# Patient Record
Sex: Female | Born: 1990
Health system: Southern US, Community
[De-identification: ages and names within clinical notes are randomized; demographics above are authoritative.]

## PROBLEM LIST (undated history)

## (undated) DIAGNOSIS — E039 Hypothyroidism, unspecified: Secondary | ICD-10-CM

## (undated) DIAGNOSIS — F32A Depression, unspecified: Secondary | ICD-10-CM

## (undated) DIAGNOSIS — F329 Major depressive disorder, single episode, unspecified: Secondary | ICD-10-CM

## (undated) HISTORY — DX: Major depressive disorder, single episode, unspecified: F32.9

## (undated) HISTORY — PX: WISDOM TOOTH EXTRACTION: SHX21

## (undated) HISTORY — DX: Depression, unspecified: F32.A

## (undated) HISTORY — DX: Hypothyroidism, unspecified: E03.9

---

## 2012-10-15 LAB — HM PAP SMEAR

## 2015-08-30 ENCOUNTER — Other Ambulatory Visit: Payer: 59

## 2015-08-30 ENCOUNTER — Encounter: Payer: 59 | Admitting: Family Medicine

## 2015-08-30 ENCOUNTER — Encounter: Payer: Self-pay | Admitting: Family Medicine

## 2015-08-30 ENCOUNTER — Other Ambulatory Visit (INDEPENDENT_AMBULATORY_CARE_PROVIDER_SITE_OTHER): Payer: 59

## 2015-08-30 ENCOUNTER — Ambulatory Visit (INDEPENDENT_AMBULATORY_CARE_PROVIDER_SITE_OTHER): Payer: 59 | Admitting: Family Medicine

## 2015-08-30 VITALS — BP 102/70 | HR 76 | Temp 98.3°F | Resp 16 | Ht 62.0 in | Wt 126.0 lb

## 2015-08-30 DIAGNOSIS — R7989 Other specified abnormal findings of blood chemistry: Secondary | ICD-10-CM

## 2015-08-30 DIAGNOSIS — Z0000 Encounter for general adult medical examination without abnormal findings: Secondary | ICD-10-CM | POA: Insufficient documentation

## 2015-08-30 LAB — LIPID PANEL
CHOL/HDL RATIO: 3
CHOLESTEROL: 138 mg/dL (ref 0–200)
HDL: 55 mg/dL (ref >39.00)
LDL CALC: 76 mg/dL (ref 0–99)
NonHDL: 82.63
TRIGLYCERIDES: 33 mg/dL (ref 0.0–149.0)
VLDL: 6.6 mg/dL (ref 0.0–40.0)

## 2015-08-30 LAB — CBC WITH DIFFERENTIAL/PLATELET
Basophils Absolute: 0 10*3/uL (ref 0.0–0.1)
Basophils Relative: 0.7 % (ref 0.0–3.0)
EOS PCT: 2.3 % (ref 0.0–5.0)
Eosinophils Absolute: 0.1 10*3/uL (ref 0.0–0.7)
HEMATOCRIT: 42.8 % (ref 36.0–46.0)
HEMOGLOBIN: 14.3 g/dL (ref 12.0–15.0)
LYMPHS PCT: 33.8 % (ref 12.0–46.0)
Lymphs Abs: 1.6 10*3/uL (ref 0.7–4.0)
MCHC: 33.4 g/dL (ref 30.0–36.0)
MCV: 92.1 fl (ref 78.0–100.0)
MONOS PCT: 10.2 % (ref 3.0–12.0)
Monocytes Absolute: 0.5 10*3/uL (ref 0.1–1.0)
Neutro Abs: 2.5 10*3/uL (ref 1.4–7.7)
Neutrophils Relative %: 53 % (ref 43.0–77.0)
Platelets: 189 10*3/uL (ref 150.0–400.0)
RBC: 4.65 Mil/uL (ref 3.87–5.11)
RDW: 13.9 % (ref 11.5–15.5)
WBC: 4.6 10*3/uL (ref 4.0–10.5)

## 2015-08-30 LAB — BASIC METABOLIC PANEL
BUN: 19 mg/dL (ref 6–23)
CALCIUM: 9.5 mg/dL (ref 8.4–10.5)
CO2: 27 mEq/L (ref 19–32)
CREATININE: 0.71 mg/dL (ref 0.40–1.20)
Chloride: 105 mEq/L (ref 96–112)
GFR: 106.66 mL/min (ref >60.00)
GLUCOSE: 91 mg/dL (ref 70–99)
Potassium: 3.6 mEq/L (ref 3.5–5.1)
Sodium: 139 mEq/L (ref 135–145)

## 2015-08-30 LAB — VITAMIN D 25 HYDROXY (VIT D DEFICIENCY, FRACTURES): VITD: 41.57 ng/mL (ref 30.00–100.00)

## 2015-08-30 LAB — HEPATIC FUNCTION PANEL
ALBUMIN: 4.4 g/dL (ref 3.5–5.2)
ALK PHOS: 52 U/L (ref 39–117)
ALT: 10 U/L (ref 0–35)
AST: 22 U/L (ref 0–37)
Bilirubin, Direct: 0.1 mg/dL (ref 0.0–0.3)
TOTAL PROTEIN: 7.5 g/dL (ref 6.0–8.3)
Total Bilirubin: 0.6 mg/dL (ref 0.2–1.2)

## 2015-08-30 LAB — TSH: TSH: 3.95 u[IU]/mL (ref 0.35–4.50)

## 2015-08-30 LAB — T4, FREE: FREE T4: 0.68 ng/dL (ref 0.60–1.60)

## 2015-08-30 LAB — T3, FREE: T3 FREE: 2.5 pg/mL (ref 2.3–4.2)

## 2015-08-30 NOTE — Assessment & Plan Note (Signed)
Pt's PE WNL.  Due for pap but pt started her cycle yesterday.  Will reschedule her pap at her convenience.  Will get labs.  Anticipatory guidance provided.

## 2015-08-30 NOTE — Progress Notes (Signed)
   Subjective:    Patient ID: Tara Reyes, female    DOB: [birth year], 25 y.o.   MRN: [id number]  HPI New to establish.  Previous MD- [facility]  CPE- no concerns today, due for pap but started cycle   Review of Systems Patient reports no vision/ hearing changes, adenopathy,fever, weight change,  persistant/recurrent hoarseness , swallowing issues, chest pain, palpitations, edema, persistant/recurrent cough, hemoptysis, dyspnea (rest/exertional/paroxysmal nocturnal), gastrointestinal bleeding (melena, rectal bleeding), abdominal pain, significant heartburn, bowel changes, GU symptoms (dysuria, hematuria, incontinence), Gyn symptoms (abnormal  bleeding, pain),  syncope, focal weakness, memory loss, numbness & tingling, skin/hair/nail changes, abnormal bruising or bleeding, anxiety, or depression.     Objective:   Physical Exam General Appearance:    Alert, cooperative, no distress, appears stated age  Head:    Normocephalic, without obvious abnormality, atraumatic  Eyes:    PERRL, conjunctiva/corneas clear, EOM's intact, fundi    benign, both eyes  Ears:    Normal TM's and external ear canals, both ears  Nose:   Nares normal, septum midline, mucosa normal, no drainage    or sinus tenderness  Throat:   Lips, mucosa, and tongue normal; teeth and gums normal  Neck:   Supple, symmetrical, trachea midline, no adenopathy;    Thyroid: no enlargement/tenderness/nodules  Back:     Symmetric, no curvature, ROM normal, no CVA tenderness  Lungs:     Clear to auscultation bilaterally, respirations unlabored  Chest Wall:    No tenderness or deformity   Heart:    Regular rate and rhythm, S1 and S2 normal, no murmur, rub   or gallop  Breast Exam:    Deferred to GYN  Abdomen:     Soft, non-tender, bowel sounds active all four quadrants,    no masses, no organomegaly  Genitalia:    Deferred to GYN  Rectal:    Extremities:   Extremities normal, atraumatic, no cyanosis or edema  Pulses:   2+ and  symmetric all extremities  Skin:   Skin color, texture, turgor normal, no rashes or lesions  Lymph nodes:   Cervical, supraclavicular, and axillary nodes normal  Neurologic:   CNII-XII intact, normal strength, sensation and reflexes    throughout          Assessment & Plan:

## 2015-08-30 NOTE — Progress Notes (Signed)
Pre visit review using our clinic review tool, if applicable. No additional management support is needed unless otherwise documented below in the visit note. 

## 2015-08-30 NOTE — Patient Instructions (Signed)
Schedule a pap at your convenience We'll notify you of your lab results and make any changes if needed Keep up the good work!  You look great! Call with any questions or concerns Welcome!  We're glad to have you!!!

## 2015-08-30 NOTE — Addendum Note (Signed)
Addended by: Davis Gourd on: 08/30/2015 10:23 AM   Modules accepted: Medications

## 2015-08-31 NOTE — Progress Notes (Signed)
   Subjective:    Patient ID: Tara Reyes, female    DOB: 1990/05/03, 26 y.o.   MRN: [id number]  HPI    Review of Systems     Objective:   Physical Exam        Assessment & Plan:   This encounter was created in error - please disregard.

## 2015-09-01 ENCOUNTER — Encounter: Payer: Self-pay | Admitting: Family Medicine

## 2015-10-08 ENCOUNTER — Ambulatory Visit (INDEPENDENT_AMBULATORY_CARE_PROVIDER_SITE_OTHER): Payer: 59 | Admitting: Family Medicine

## 2015-10-08 ENCOUNTER — Encounter: Payer: Self-pay | Admitting: Family Medicine

## 2015-10-08 ENCOUNTER — Other Ambulatory Visit (HOSPITAL_COMMUNITY)
Admission: RE | Admit: 2015-10-08 | Discharge: 2015-10-08 | Disposition: A | Discharge status: Home / Self Care | Payer: 59 | Source: Ambulatory Visit | Attending: Family Medicine | Admitting: Family Medicine

## 2015-10-08 VITALS — BP 102/72 | HR 77 | Temp 98.1°F | Resp 16 | Ht 62.0 in | Wt 125.2 lb

## 2015-10-08 DIAGNOSIS — Z01419 Encounter for gynecological examination (general) (routine) without abnormal findings: Secondary | ICD-10-CM | POA: Diagnosis not present

## 2015-10-08 DIAGNOSIS — D235 Other benign neoplasm of skin of trunk: Secondary | ICD-10-CM | POA: Diagnosis not present

## 2015-10-08 DIAGNOSIS — Z124 Encounter for screening for malignant neoplasm of cervix: Secondary | ICD-10-CM | POA: Diagnosis not present

## 2015-10-08 DIAGNOSIS — Z1151 Encounter for screening for human papillomavirus (HPV): Secondary | ICD-10-CM | POA: Diagnosis not present

## 2015-10-08 DIAGNOSIS — H5203 Hypermetropia, bilateral: Secondary | ICD-10-CM | POA: Diagnosis not present

## 2015-10-08 DIAGNOSIS — D225 Melanocytic nevi of trunk: Secondary | ICD-10-CM

## 2015-10-08 NOTE — Progress Notes (Signed)
   Subjective:    Patient ID: Tara Reyes, female    DOB: August 27, 1990, 25 y.o.   MRN: [id number]  HPI Here today for pap and breast exam   Review of Systems No breast masses, skin changes, nipple discharge, vaginal discharge, itching/burning, abnormal bleeding    Objective:   Physical Exam  Constitutional: She appears well-developed and well-nourished. No distress.  HENT:  Head: Normocephalic and atraumatic.  Pulmonary/Chest: Right breast exhibits no inverted nipple, no mass, no nipple discharge, no skin change and no tenderness. Left breast exhibits no inverted nipple, no mass, no nipple discharge, no skin change and no tenderness.  Genitourinary: Rectal exam shows no external hemorrhoid, no internal hemorrhoid, no fissure, no mass and no tenderness. There is no rash, tenderness, lesion or injury on the right labia. There is no rash, tenderness, lesion or injury on the left labia. Uterus is not deviated, not enlarged, not fixed and not tender. Cervix exhibits no motion tenderness, no discharge and no friability. Right adnexum displays no mass, no tenderness and no fullness. Left adnexum displays no mass, no tenderness and no fullness. No erythema, tenderness or bleeding in the vagina. No foreign body around the vagina. No signs of injury around the vagina. No vaginal discharge found.  Skin: Skin is warm and dry.  Atypical nevus R groin  Vitals reviewed.         Assessment & Plan:  GYN exam- breast and pelvic exam WNL.  Pap collected  Atypical Nevus- R groin, refer to derm for removal

## 2015-10-08 NOTE — Progress Notes (Signed)
Pre visit review using our clinic review tool, if applicable. No additional management support is needed unless otherwise documented below in the visit note. 

## 2015-10-11 LAB — CYTOLOGY - PAP

## 2015-11-30 DIAGNOSIS — D2271 Melanocytic nevi of right lower limb, including hip: Secondary | ICD-10-CM | POA: Diagnosis not present

## 2015-11-30 DIAGNOSIS — D225 Melanocytic nevi of trunk: Secondary | ICD-10-CM | POA: Diagnosis not present

## 2015-11-30 DIAGNOSIS — D2272 Melanocytic nevi of left lower limb, including hip: Secondary | ICD-10-CM | POA: Diagnosis not present

## 2015-11-30 DIAGNOSIS — D2262 Melanocytic nevi of left upper limb, including shoulder: Secondary | ICD-10-CM | POA: Diagnosis not present

## 2015-12-23 ENCOUNTER — Encounter: Payer: Self-pay | Admitting: Family Medicine

## 2016-05-17 ENCOUNTER — Ambulatory Visit (INDEPENDENT_AMBULATORY_CARE_PROVIDER_SITE_OTHER): Payer: BLUE CROSS/BLUE SHIELD | Admitting: Family Medicine

## 2016-05-17 DIAGNOSIS — Z23 Encounter for immunization: Secondary | ICD-10-CM

## 2016-05-17 DIAGNOSIS — Z111 Encounter for screening for respiratory tuberculosis: Secondary | ICD-10-CM

## 2016-05-17 NOTE — Progress Notes (Signed)
Pt here today for Tdap and PPD.  Given by CMA.  Tolerated w/o difficulty.

## 2016-05-19 LAB — TB SKIN TEST
Induration: 0 mm
TB SKIN TEST: NEGATIVE

## 2016-09-22 ENCOUNTER — Encounter: Payer: Self-pay | Admitting: Family Medicine

## 2016-09-22 ENCOUNTER — Ambulatory Visit (INDEPENDENT_AMBULATORY_CARE_PROVIDER_SITE_OTHER): Payer: BLUE CROSS/BLUE SHIELD | Admitting: Family Medicine

## 2016-09-22 ENCOUNTER — Other Ambulatory Visit: Payer: Self-pay | Admitting: General Practice

## 2016-09-22 VITALS — BP 100/70 | HR 65 | Temp 98.2°F | Resp 16 | Ht 62.0 in | Wt 123.4 lb

## 2016-09-22 DIAGNOSIS — R7989 Other specified abnormal findings of blood chemistry: Secondary | ICD-10-CM

## 2016-09-22 DIAGNOSIS — Z0000 Encounter for general adult medical examination without abnormal findings: Secondary | ICD-10-CM | POA: Diagnosis not present

## 2016-09-22 LAB — CBC WITH DIFFERENTIAL/PLATELET
BASOS PCT: 1.2 % (ref 0.0–3.0)
Basophils Absolute: 0.1 10*3/uL (ref 0.0–0.1)
EOS ABS: 0.1 10*3/uL (ref 0.0–0.7)
Eosinophils Relative: 2.5 % (ref 0.0–5.0)
HEMATOCRIT: 44.3 % (ref 36.0–46.0)
HEMOGLOBIN: 15 g/dL (ref 12.0–15.0)
LYMPHS PCT: 31.4 % (ref 12.0–46.0)
Lymphs Abs: 1.4 10*3/uL (ref 0.7–4.0)
MCHC: 33.8 g/dL (ref 30.0–36.0)
MCV: 93.8 fl (ref 78.0–100.0)
Monocytes Absolute: 0.6 10*3/uL (ref 0.1–1.0)
Monocytes Relative: 12.1 % — ABNORMAL HIGH (ref 3.0–12.0)
Neutro Abs: 2.4 10*3/uL (ref 1.4–7.7)
Neutrophils Relative %: 52.8 % (ref 43.0–77.0)
Platelets: 209 10*3/uL (ref 150.0–400.0)
RBC: 4.72 Mil/uL (ref 3.87–5.11)
RDW: 12.5 % (ref 11.5–15.5)
WBC: 4.6 10*3/uL (ref 4.0–10.5)

## 2016-09-22 LAB — HEPATIC FUNCTION PANEL
ALT: 9 U/L (ref 0–35)
AST: 21 U/L (ref 0–37)
Albumin: 4.6 g/dL (ref 3.5–5.2)
Alkaline Phosphatase: 50 U/L (ref 39–117)
BILIRUBIN DIRECT: 0.1 mg/dL (ref 0.0–0.3)
BILIRUBIN TOTAL: 0.7 mg/dL (ref 0.2–1.2)
TOTAL PROTEIN: 7.5 g/dL (ref 6.0–8.3)

## 2016-09-22 LAB — VITAMIN D 25 HYDROXY (VIT D DEFICIENCY, FRACTURES): VITD: 30.38 ng/mL (ref 30.00–100.00)

## 2016-09-22 LAB — LIPID PANEL
CHOL/HDL RATIO: 3
Cholesterol: 152 mg/dL (ref 0–200)
HDL: 58.4 mg/dL (ref >39.00)
LDL CALC: 85 mg/dL (ref 0–99)
NonHDL: 93.34
TRIGLYCERIDES: 44 mg/dL (ref 0.0–149.0)
VLDL: 8.8 mg/dL (ref 0.0–40.0)

## 2016-09-22 LAB — BASIC METABOLIC PANEL
BUN: 18 mg/dL (ref 6–23)
CALCIUM: 9.6 mg/dL (ref 8.4–10.5)
CO2: 27 mEq/L (ref 19–32)
CREATININE: 0.73 mg/dL (ref 0.40–1.20)
Chloride: 105 mEq/L (ref 96–112)
GFR: 102.42 mL/min (ref >60.00)
Glucose, Bld: 90 mg/dL (ref 70–99)
Potassium: 4.7 mEq/L (ref 3.5–5.1)
Sodium: 140 mEq/L (ref 135–145)

## 2016-09-22 LAB — TSH: TSH: 4.87 u[IU]/mL — AB (ref 0.35–4.50)

## 2016-09-22 MED ORDER — LEVOTHYROXINE SODIUM 50 MCG PO TABS: 50.0000 ug | ORAL_TABLET | Freq: Every day | ORAL | 2 refills | Status: DC

## 2016-09-22 MED FILL — LEVOTHYROXINE 50 MCG TABLET: 50 | 30 days supply | Qty: 30 | Fill #0

## 2016-09-22 NOTE — Assessment & Plan Note (Signed)
Pt's PE WNL.  UTD on pap, Tdap.  Check labs.  Anticipatory guidance provided.  

## 2016-09-22 NOTE — Progress Notes (Signed)
Pre visit review using our clinic review tool, if applicable. No additional management support is needed unless otherwise documented below in the visit note. 

## 2016-09-22 NOTE — Patient Instructions (Signed)
Follow up in 1 year or as needed We'll notify you of your lab results and make any changes if needed Keep up the good work!  You look great!!! Call with any questions or concerns Happy Birthday!!!

## 2016-09-22 NOTE — Progress Notes (Signed)
   Subjective:    Patient ID: Tara Reyes, female    DOB: 30-Oct-1990, 26 y.o.   MRN: [id number]  HPI CPE- no concerns today, UTD on pap, Tdap.     Review of Systems Patient reports no vision/ hearing changes, adenopathy,fever, weight change,  persistant/recurrent hoarseness , swallowing issues, chest pain, palpitations, edema, persistant/recurrent cough, hemoptysis, dyspnea (rest/exertional/paroxysmal nocturnal), gastrointestinal bleeding (melena, rectal bleeding), abdominal pain, significant heartburn, bowel changes, GU symptoms (dysuria, hematuria, incontinence), Gyn symptoms (abnormal  bleeding, pain),  syncope, focal weakness, memory loss, numbness & tingling, skin/hair/nail changes, abnormal bruising or bleeding, anxiety, or depression.     Objective:   Physical Exam  General Appearance:    Alert, cooperative, no distress, appears stated age  Head:    Normocephalic, without obvious abnormality, atraumatic  Eyes:    PERRL, conjunctiva/corneas clear, EOM's intact, fundi    benign, both eyes  Ears:    Normal TM's and external ear canals, both ears  Nose:   Nares normal, septum midline, mucosa normal, no drainage    or sinus tenderness  Throat:   Lips, mucosa, and tongue normal; teeth and gums normal  Neck:   Supple, symmetrical, trachea midline, no adenopathy;    Thyroid: no enlargement/tenderness/nodules  Back:     Symmetric, no curvature, ROM normal, no CVA tenderness  Lungs:     Clear to auscultation bilaterally, respirations unlabored  Chest Wall:    No tenderness or deformity   Heart:    Regular rate and rhythm, S1 and S2 normal, no murmur, rub   or gallop  Breast Exam:    No tenderness, masses, or nipple abnormality  Abdomen:     Soft, non-tender, bowel sounds active all four quadrants,    no masses, no organomegaly  Genitalia:    deferred  Rectal:    Extremities:   Extremities normal, atraumatic, no cyanosis or edema  Pulses:   2+ and symmetric all extremities  Skin:    Skin color, texture, turgor normal, no rashes or lesions  Lymph nodes:   Cervical, supraclavicular, and axillary nodes normal  Neurologic:   CNII-XII intact, normal strength, sensation and reflexes    throughout          Assessment & Plan:

## 2016-09-26 ENCOUNTER — Encounter: Payer: Self-pay | Admitting: Family Medicine

## 2016-10-17 MED FILL — LEVOTHYROXINE 50 MCG TABLET: 50 | 30 days supply | Qty: 30 | Fill #1

## 2016-11-07 ENCOUNTER — Other Ambulatory Visit (INDEPENDENT_AMBULATORY_CARE_PROVIDER_SITE_OTHER): Payer: BLUE CROSS/BLUE SHIELD

## 2016-11-07 DIAGNOSIS — R946 Abnormal results of thyroid function studies: Secondary | ICD-10-CM

## 2016-11-07 DIAGNOSIS — R7989 Other specified abnormal findings of blood chemistry: Secondary | ICD-10-CM

## 2016-11-07 LAB — TSH: TSH: 2.1 u[IU]/mL (ref 0.35–4.50)

## 2016-11-08 ENCOUNTER — Other Ambulatory Visit: Payer: Self-pay | Admitting: General Practice

## 2016-11-08 MED ORDER — LEVOTHYROXINE SODIUM 50 MCG PO TABS: 50.0000 ug | ORAL_TABLET | Freq: Every day | ORAL | 1 refills | Status: DC

## 2017-05-16 ENCOUNTER — Encounter: Payer: Self-pay | Admitting: Family Medicine

## 2017-05-17 MED ORDER — LEVOTHYROXINE SODIUM 50 MCG PO TABS: 50.0000 ug | ORAL_TABLET | Freq: Every day | ORAL | 1 refills | Status: DC

## 2017-05-17 MED FILL — LEVOTHYROXINE 50 MCG TABLET: 50 | 90 days supply | Qty: 90 | Fill #0

## 2017-05-31 ENCOUNTER — Encounter: Payer: Self-pay | Admitting: Family Medicine

## 2017-06-01 MED ORDER — CLONAZEPAM 0.5 MG PO TABS: 0.5000 mg | ORAL_TABLET | Freq: Two times a day (BID) | ORAL | 0 refills | Status: DC | PRN

## 2017-06-01 MED FILL — clonazePAM 0.5 MG TABS: 0.5 | 10 days supply | Qty: 20 | Fill #0

## 2017-06-07 MED FILL — AZITHROMYCIN 500 MG TABLET: 500 | 3 days supply | Qty: 3 | Fill #0

## 2017-06-07 MED FILL — ATOVAQUONE-PROGUANIL 250-10: 250-100 | 22 days supply | Qty: 22 | Fill #0

## 2017-08-09 ENCOUNTER — Encounter: Payer: Self-pay | Admitting: Family Medicine

## 2017-08-09 ENCOUNTER — Ambulatory Visit (INDEPENDENT_AMBULATORY_CARE_PROVIDER_SITE_OTHER): Payer: No Typology Code available for payment source | Admitting: Family Medicine

## 2017-08-09 ENCOUNTER — Other Ambulatory Visit: Payer: Self-pay

## 2017-08-09 DIAGNOSIS — F339 Major depressive disorder, recurrent, unspecified: Secondary | ICD-10-CM | POA: Insufficient documentation

## 2017-08-09 MED ORDER — BUPROPION HCL ER (XL) 150 MG PO TB24: 150.0000 mg | ORAL_TABLET | Freq: Every day | ORAL | 3 refills | Status: DC

## 2017-08-09 MED FILL — buPROPion HCL ER (XL) 150 M: 150 | 30 days supply | Qty: 30 | Fill #0

## 2017-08-09 NOTE — Patient Instructions (Addendum)
Follow up as scheduled for your physical and to recheck mood START the Wellbutrin daily to improve mood Continue to work through therapy- I'm proud of you! Call with any questions or concerns Hang in there!!!

## 2017-08-09 NOTE — Assessment & Plan Note (Signed)
New to provider, recurrent problem for pt.  She tolerated Wellbutrin well in the past when Celexa and Zoloft were ineffective.  Given her low energy and low motivation, will restart Wellbutrin.  Applauded her efforts at counseling.  Will continue to follow closely.

## 2017-08-09 NOTE — Progress Notes (Signed)
   Subjective:    Patient ID: Tara Reyes, female    DOB: 08-30-1990, 27 y.o.   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  Hx of depression that has been triggered by 'life changes' in the past.  Pt has had 'a really hard time coping with the changes'.  Started counseling a month ago.  Pt will consider medication despite not wanting to b/c she feels like 'I have lost 8-10 months of my life'.  Poor sleep, low energy, poor motivation, increased fatigue.  Pt took medication previously- Celexa was ineffective.  Zoloft and Wellbutrin were used in combination and then Wellbutrin was used independently.  Husband is supportive.  Seeing Mardene Celeste at [employer]   Review of Systems For ROS see HPI     Objective:   Physical Exam  Constitutional: She is oriented to person, place, and time. She appears well-developed and well-nourished. No distress.  HENT:  Head: Normocephalic and atraumatic.  Neurological: She is alert and oriented to person, place, and time. No cranial nerve deficit.  Skin: Skin is warm and dry.  Psychiatric: Her behavior is normal. Thought content normal.  Flat affect, quiet and withdrawn today  Vitals reviewed.         Assessment & Plan:

## 2017-08-23 MED FILL — LEVOTHYROXINE 50 MCG TABLET: 50 | 90 days supply | Qty: 90 | Fill #1

## 2017-09-06 MED FILL — buPROPion HCL ER (XL) 150 M: 150 | 30 days supply | Qty: 30 | Fill #1

## 2017-09-26 ENCOUNTER — Encounter: Payer: Self-pay | Admitting: Family Medicine

## 2017-09-26 ENCOUNTER — Ambulatory Visit (INDEPENDENT_AMBULATORY_CARE_PROVIDER_SITE_OTHER): Payer: No Typology Code available for payment source | Admitting: Family Medicine

## 2017-09-26 ENCOUNTER — Other Ambulatory Visit: Payer: Self-pay

## 2017-09-26 VITALS — BP 118/64 | HR 90 | Temp 98.5°F | Resp 16 | Ht 62.25 in | Wt 119.6 lb

## 2017-09-26 DIAGNOSIS — Z0000 Encounter for general adult medical examination without abnormal findings: Secondary | ICD-10-CM | POA: Diagnosis not present

## 2017-09-26 DIAGNOSIS — E039 Hypothyroidism, unspecified: Secondary | ICD-10-CM | POA: Diagnosis not present

## 2017-09-26 LAB — BASIC METABOLIC PANEL
BUN: 8 mg/dL (ref 6–23)
CALCIUM: 9.1 mg/dL (ref 8.4–10.5)
CO2: 29 meq/L (ref 19–32)
Chloride: 103 mEq/L (ref 96–112)
Creatinine, Ser: 0.57 mg/dL (ref 0.40–1.20)
GFR: 135.22 mL/min (ref >60.00)
GLUCOSE: 88 mg/dL (ref 70–99)
Potassium: 3.8 mEq/L (ref 3.5–5.1)
SODIUM: 138 meq/L (ref 135–145)

## 2017-09-26 LAB — CBC WITH DIFFERENTIAL/PLATELET
BASOS ABS: 0.1 10*3/uL (ref 0.0–0.1)
Basophils Relative: 0.9 % (ref 0.0–3.0)
EOS ABS: 0.1 10*3/uL (ref 0.0–0.7)
Eosinophils Relative: 1.4 % (ref 0.0–5.0)
HEMATOCRIT: 42.1 % (ref 36.0–46.0)
Hemoglobin: 14.1 g/dL (ref 12.0–15.0)
LYMPHS PCT: 23.4 % (ref 12.0–46.0)
Lymphs Abs: 1.4 10*3/uL (ref 0.7–4.0)
MCHC: 33.4 g/dL (ref 30.0–36.0)
MCV: 94.3 fl (ref 78.0–100.0)
Monocytes Absolute: 0.5 10*3/uL (ref 0.1–1.0)
Monocytes Relative: 8.2 % (ref 3.0–12.0)
NEUTROS ABS: 4 10*3/uL (ref 1.4–7.7)
NEUTROS PCT: 66.1 % (ref 43.0–77.0)
PLATELETS: 206 10*3/uL (ref 150.0–400.0)
RBC: 4.47 Mil/uL (ref 3.87–5.11)
RDW: 13 % (ref 11.5–15.5)
WBC: 6 10*3/uL (ref 4.0–10.5)

## 2017-09-26 LAB — HEPATIC FUNCTION PANEL
ALBUMIN: 4.3 g/dL (ref 3.5–5.2)
ALT: 10 U/L (ref 0–35)
AST: 16 U/L (ref 0–37)
Alkaline Phosphatase: 52 U/L (ref 39–117)
BILIRUBIN DIRECT: 0.1 mg/dL (ref 0.0–0.3)
TOTAL PROTEIN: 6.8 g/dL (ref 6.0–8.3)
Total Bilirubin: 0.5 mg/dL (ref 0.2–1.2)

## 2017-09-26 LAB — LIPID PANEL
CHOL/HDL RATIO: 3
Cholesterol: 161 mg/dL (ref 0–200)
HDL: 63.3 mg/dL (ref >39.00)
LDL CALC: 90 mg/dL (ref 0–99)
NONHDL: 97.35
TRIGLYCERIDES: 39 mg/dL (ref 0.0–149.0)
VLDL: 7.8 mg/dL (ref 0.0–40.0)

## 2017-09-26 LAB — TSH: TSH: 1.38 u[IU]/mL (ref 0.35–4.50)

## 2017-09-26 NOTE — Patient Instructions (Signed)
Follow up in 1 year or as needed We'll notify you of your lab results and make any changes if needed Keep up the good work on healthy diet and regular exercise- you look great! Call with any questions or concerns Have a great summer!!! 

## 2017-09-26 NOTE — Assessment & Plan Note (Signed)
Ongoing issue.  Currently asymptomatic.  Check labs.  Adjust meds prn  

## 2017-09-26 NOTE — Progress Notes (Signed)
   Subjective:    Patient ID: Tara Reyes, female    DOB: 01/16/1991, 27 y.o.   MRN: [id number]  HPI CPE- UTD on pap, immunizations.  No concerns today.   Review of Systems Patient reports no vision/ hearing changes, adenopathy,fever, weight change,  persistant/recurrent hoarseness , swallowing issues, chest pain, palpitations, edema, persistant/recurrent cough, hemoptysis, dyspnea (rest/exertional/paroxysmal nocturnal), gastrointestinal bleeding (melena, rectal bleeding), abdominal pain, significant heartburn, bowel changes, GU symptoms (dysuria, hematuria, incontinence), Gyn symptoms (abnormal  bleeding, pain),  syncope, focal weakness, memory loss, numbness & tingling, skin/hair/nail changes, abnormal bruising or bleeding, anxiety, or depression.     Objective:   Physical Exam  General Appearance:    Alert, cooperative, no distress, appears stated age  Head:    Normocephalic, without obvious abnormality, atraumatic  Eyes:    PERRL, conjunctiva/corneas clear, EOM's intact, fundi    benign, both eyes  Ears:    Normal TM's and external ear canals, both ears  Nose:   Nares normal, septum midline, mucosa normal, no drainage    or sinus tenderness  Throat:   Lips, mucosa, and tongue normal; teeth and gums normal  Neck:   Supple, symmetrical, trachea midline, no adenopathy;    Thyroid: no enlargement/tenderness/nodules  Back:     Symmetric, no curvature, ROM normal, no CVA tenderness  Lungs:     Clear to auscultation bilaterally, respirations unlabored  Chest Wall:    No tenderness or deformity   Heart:    Regular rate and rhythm, S1 and S2 normal, no murmur, rub   or gallop  Breast Exam:    No tenderness, masses, or nipple abnormality  Abdomen:     Soft, non-tender, bowel sounds active all four quadrants,    no masses, no organomegaly  Genitalia:    deferred  Rectal:    Extremities:   Extremities normal, atraumatic, no cyanosis or edema  Pulses:   2+ and symmetric all extremities    Skin:   Skin color, texture, turgor normal, no rashes or lesions  Lymph nodes:   Cervical, supraclavicular, and axillary nodes normal  Neurologic:   CNII-XII intact, normal strength, sensation and reflexes    throughout          Assessment & Plan:

## 2017-09-26 NOTE — Assessment & Plan Note (Signed)
Pt's PE WNL.  UTD on immunizations.  Check labs.  Anticipatory guidance provided.  

## 2017-10-10 MED FILL — buPROPion HCL ER (XL) 150 M: 150 | 30 days supply | Qty: 30 | Fill #2

## 2017-11-15 MED FILL — buPROPion HCL ER (XL) 150 M: 150 | 30 days supply | Qty: 30 | Fill #3

## 2017-11-27 ENCOUNTER — Other Ambulatory Visit: Payer: Self-pay | Admitting: Family Medicine

## 2017-11-27 MED FILL — LEVOTHYROXINE 50 MCG TABLET: 50 | 90 days supply | Qty: 90 | Fill #0

## 2017-12-17 ENCOUNTER — Encounter: Payer: Self-pay | Admitting: Family Medicine

## 2017-12-17 DIAGNOSIS — Z111 Encounter for screening for respiratory tuberculosis: Secondary | ICD-10-CM

## 2017-12-18 ENCOUNTER — Other Ambulatory Visit (INDEPENDENT_AMBULATORY_CARE_PROVIDER_SITE_OTHER): Payer: No Typology Code available for payment source

## 2017-12-18 DIAGNOSIS — Z111 Encounter for screening for respiratory tuberculosis: Secondary | ICD-10-CM | POA: Diagnosis not present

## 2017-12-18 MED ORDER — BUPROPION HCL ER (XL) 150 MG PO TB24: 150.0000 mg | ORAL_TABLET | Freq: Every day | ORAL | 1 refills | Status: DC

## 2017-12-18 MED FILL — buPROPion HCL ER (XL) 150 M: 150 | 90 days supply | Qty: 90 | Fill #0

## 2017-12-21 LAB — QUANTIFERON-TB GOLD PLUS
Mitogen-NIL: >10 IU/mL
NIL: 0.03 IU/mL
QUANTIFERON-TB GOLD PLUS: NEGATIVE
TB1-NIL: 0.01 IU/mL
TB2-NIL: 0.02 IU/mL

## 2018-02-13 ENCOUNTER — Ambulatory Visit (INDEPENDENT_AMBULATORY_CARE_PROVIDER_SITE_OTHER): Payer: No Typology Code available for payment source | Admitting: Family Medicine

## 2018-02-13 ENCOUNTER — Other Ambulatory Visit: Payer: Self-pay

## 2018-02-13 ENCOUNTER — Encounter: Payer: Self-pay | Admitting: Family Medicine

## 2018-02-13 ENCOUNTER — Ambulatory Visit (INDEPENDENT_AMBULATORY_CARE_PROVIDER_SITE_OTHER): Payer: No Typology Code available for payment source

## 2018-02-13 VITALS — BP 108/68 | HR 63 | Temp 97.9°F | Ht 62.25 in | Wt 120.9 lb

## 2018-02-13 DIAGNOSIS — M25561 Pain in right knee: Secondary | ICD-10-CM

## 2018-02-13 NOTE — Patient Instructions (Signed)
Consider alternative activity to running such as cycling for the next few weeks  Consider icing 15-20 minutes 2-3 times daily and especially after exercise  Touch base if no better in 2-3 weeks.

## 2018-02-13 NOTE — Progress Notes (Signed)
  Subjective:     Patient ID: Tara Reyes, female   DOB: 1990-07-03, 27 y.o.   MRN: [id number]  HPI Patient is seen with right knee pain.  Onset on the ninth of this month.  She was in the middle of a long run and was going down a hill and around a corner when she noticed some mostly lateral knee pain.  No buckling or giving way.  She was in process of training for half marathon.  She has tried running a few times since then but has had some pain with running.  No visible swelling.  No warmth.  No ecchymosis.  Pain is mostly anterior and lateral.  No medial pain.  No history of prior knee difficulties.  Past Medical History:  Diagnosis Date  . Depression    Past Surgical History:  Procedure Laterality Date  . WISDOM TOOTH EXTRACTION      reports that she has never smoked. She has never used smokeless tobacco. She reports that she drinks alcohol. She reports that she does not use drugs. family history includes Arthritis in her maternal grandmother; Deep vein thrombosis in her paternal grandfather; Gout in her father; Heart attack in her maternal grandfather; Hypertension in her father and paternal grandfather. Allergies  Allergen Reactions  . Other Other (See Comments)    Sensitive to Vicryl sutures  . Penicillins Hives  . Other     Vicryl Suture  . Penicillins Hives     Review of Systems  Neurological: Negative for weakness.       Objective:   Physical Exam  Constitutional: She appears well-developed and well-nourished.  Musculoskeletal:  Right knee reveals good range of motion.  No effusion.  No visible swelling.  No warmth.  No erythema.  No medial or lateral joint line tenderness.  No patellar tenderness.  Ligament testing is normal.  No ITB tenderness.       Assessment:     Right knee pain.  Question mild lateral meniscus injury versus pinched synovial plica.  Doubt IT band syndrome    Plan:     -Recommended alternative exercise for the next couple weeks such as  cycling -Consider icing 15 to 20 minutes 2-3 times daily and especially after exercise -Touch base if not improving over the next couple weeks.  Consider sports medicine referral if not improved in 2 to 3 weeks  Eulas Post MD [facility] at [facility]

## 2018-02-14 ENCOUNTER — Encounter: Payer: Self-pay | Admitting: Family Medicine

## 2018-02-25 MED FILL — buPROPion HCL ER (XL) 150 M: 150 | 90 days supply | Qty: 90 | Fill #1

## 2018-02-25 MED FILL — LEVOTHYROXINE 50 MCG TABLET: 50 | 90 days supply | Qty: 90 | Fill #1

## 2018-04-24 ENCOUNTER — Encounter: Payer: Self-pay | Admitting: Family Medicine

## 2018-04-24 ENCOUNTER — Other Ambulatory Visit: Payer: Self-pay

## 2018-04-24 ENCOUNTER — Ambulatory Visit: Payer: BLUE CROSS/BLUE SHIELD | Admitting: Family Medicine

## 2018-04-24 VITALS — BP 96/68 | HR 61 | Temp 98.1°F | Resp 16 | Ht 62.0 in | Wt 124.0 lb

## 2018-04-24 DIAGNOSIS — M25561 Pain in right knee: Secondary | ICD-10-CM | POA: Diagnosis not present

## 2018-04-24 NOTE — Patient Instructions (Signed)
Follow up as needed or as scheduled We'll call you with your Sports Med appt Ice or Ibuprofen as needed Call with any questions or concerns Hang in there!!!

## 2018-04-24 NOTE — Progress Notes (Signed)
   Subjective:    Patient ID: Tara Reyes, female    DOB: 1990-06-30, 28 y.o.   MRN: [id number]  HPI R knee pain- injured knee ~3 months ago while running.  Saw Dr Elease Hashimoto ~2 weeks after and had normal xray.  Pain is mostly gone w/ day to day activity but returns w/ activity- even prolonged walking.  Pt would like to resume running.  No swelling.  No clicking or popping.  Pain is anterior, behind knee cap, 'in the joint space'.     Review of Systems For ROS see HPI     Objective:   Physical Exam Vitals signs reviewed.  Constitutional:      Appearance: Normal appearance. She is normal weight.  Musculoskeletal: Normal range of motion.        General: Swelling (trace swelling over patella tendon on R) present. No tenderness or deformity.     Comments: Full flexion/extension of R knee  Skin:    General: Skin is warm and dry.     Findings: No rash.  Neurological:     General: No focal deficit present.     Mental Status: She is alert and oriented to person, place, and time.  Psychiatric:        Mood and Affect: Mood normal.        Behavior: Behavior normal.        Thought Content: Thought content normal.           Assessment & Plan:  R knee pain- new to provider, ongoing for pt.  Suspect patellofemoral syndrome but since this has been going on for 12 weeks will refer to Sports Med for complete evaluation and tx.  Pt expressed understanding and is in agreement w/ plan.

## 2018-04-29 ENCOUNTER — Ambulatory Visit: Payer: BLUE CROSS/BLUE SHIELD | Admitting: Sports Medicine

## 2018-04-29 ENCOUNTER — Ambulatory Visit: Payer: Self-pay

## 2018-04-29 ENCOUNTER — Encounter: Payer: Self-pay | Admitting: Sports Medicine

## 2018-04-29 VITALS — BP 110/80 | HR 98 | Ht 62.0 in | Wt 122.8 lb

## 2018-04-29 DIAGNOSIS — M25561 Pain in right knee: Secondary | ICD-10-CM | POA: Insufficient documentation

## 2018-04-29 DIAGNOSIS — R29898 Other symptoms and signs involving the musculoskeletal system: Secondary | ICD-10-CM

## 2018-04-29 MED ORDER — DICLOFENAC SODIUM 2 % TD SOLN: 1.0000 "application " | Freq: Two times a day (BID) | TRANSDERMAL | 0 refills | Status: AC

## 2018-04-29 MED ORDER — DICLOFENAC SODIUM 2 % TD SOLN: 1.0000 "application " | Freq: Two times a day (BID) | TRANSDERMAL | 2 refills | Status: DC

## 2018-04-29 NOTE — Progress Notes (Signed)
Tara Reyes. [place], [place] at [place]  Tara Reyes - 28 y.o. female MRN [id number]  Date of birth: [birth year]  Visit Date: April 29, 2018  PCP: Midge Minium, MD   Referred by: Midge Minium, MD  SUBJECTIVE:  Chief Complaint  Patient presents with  . Right Knee - Initial Assessment    XR 02/13/18. Sx Started 12 weeks ago. Pain is anterior/lateral. Minimal swelling. Has tried IBU and ACE wrap. Worse with impact.   . Initial Assessment    R knee pain.  Referred by Dr. Birdie Riddle.     HPI: Patient presents with 12 weeks of worsening right generalized knee pain.  It initially began along the lateral patellar facet however has worsened over the past several months to the point that is now just generalized deep ache.  It is worse with running and impact.  Worse with the longer distance that she goes.  It initially began with 7-8/10 pain and is currently 2-3/10.  She is not having any significant mechanical symptoms.  Did begin while on a run on a sidewalk where she turned and had a sharp shooting pain but no mechanical symptoms were appreciated.  She has been resting using ice and elevation with only provement.  Ibuprofen was used intermittently with only mild improvement.  She has been using an Ace wrap and patellar strap without significant improvement.  REVIEW OF SYSTEMS: Slight lower extremity edema that is generalized especially worse at the end of the day Otherwise 12 point review of systems performed and is negative   HISTORY:  Prior history reviewed and updated per electronic medical record.  Patient Active Problem List   Diagnosis Date Noted  . Right knee pain 04/29/2018    02/13/2018 XR R knee -  IMPRESSION: No acute osseous abnormality about the right knee.   Marland Kitchen Hypothyroid 09/26/2017  . Depression, recurrent ([facility]) 08/09/2017  . Physical exam 08/30/2015   Social History   Occupational History    . Not on file  Tobacco Use  . Smoking status: Never Smoker  . Smokeless tobacco: Never Used  Substance and Sexual Activity  . Alcohol use: Yes    Alcohol/week: 0.0 standard drinks    Comment: social  . Drug use: No  . Sexual activity: Not on file   Social History   Social History Narrative   ** Merged History Encounter **       Past Medical History:  Diagnosis Date  . Depression    Past Surgical History:  Procedure Laterality Date  . WISDOM TOOTH EXTRACTION     family history includes Arthritis in her maternal grandmother; Deep vein thrombosis in her paternal grandfather; Gout in her father; Heart attack in her maternal grandfather; Hypertension in her father and paternal grandfather. Recent Labs    09/26/17 1416  CREATININE 0.57  CALCIUM 9.1  AST 16  ALT 10  TSH 1.38    OBJECTIVE:  VS:  HT:5\' 2"  (157.5 cm)   WT:122 lb 12.8 oz (55.7 kg)  BMI:22.45    BP:110/80  HR:98bpm  TEMP: ( )  RESP:100 %   PHYSICAL EXAM: CONSTITUTIONAL: Well-developed, Well-nourished and In no acute distress EYES: Pupils are equal., EOM intact without nystagmus. and No scleral icterus. Psychiatric: Alert & appropriately interactive. and Not depressed or anxious appearing. EXTREMITY EXAM: Warm and well perfused.  No appreciable edema.  Right knee: Overall well aligned without significant deformity.  She has good extensor  mechanism strength however has marked weakness with hip abduction on the right greater than left.  She has TFL predominant recruitment pattern. Her knee is ligamentously stable.  She has no significant pain with McMurray's no appreciable mechanical symptoms.   ASSESSMENT:  1. Right knee pain, unspecified chronicity   2. Weakness of right hip     PROCEDURES:  PROCEDURE NOTE: THERAPEUTIC EXERCISES (37482)   Discussed the foundation of treatment for this condition is physical therapy and/or daily (5-6 days/week) therapeutic exercises, focusing on core strengthening,  coordination, neuromuscular control/reeducation. 15 minutes spent for Therapeutic exercises as below and as referenced in the AVS. This included exercises focusing on stretching, strengthening, with significant focus on eccentric aspects.  Proper technique shown and discussed handout in great detail with ATC. All questions were discussed and answered.   Long term goals include an improvement in range of motion, strength, endurance as well as avoiding reinjury. Frequency of visits is one time as determined during today's office visit. Frequency of exercises to be performed is as per handout.  EXERCISES REVIEWED:   Hip ABduction strengthening with focus on Glute Medius Recruitment VMO Strengthening      PLAN:  Pertinent additional documentation may be included in corresponding procedure notes, imaging studies, problem based documentation and patient instructions.  No problem-specific Assessment & Plan notes found for this encounter.   Symptoms are consistent with slight quadriceps tendinopathy and a small amount of generalized synovitis with weak hip abductor's..  At this point we will have her topical anti-inflammatories to be taken for 2 to 3 weeks continuously then as needed.  Okay to resume activities as tolerated   Home Therapeutic exercises prescribed today per procedure note.  RICE (Rest, ICE, Compression, Elevation) principles reviewed with the patient.  Activity modifications and the importance of avoiding exacerbating activities (limiting pain to no more than a 4 / 10 during or following activity) recommended and discussed.  Discussed red flag symptoms that warrant earlier emergent evaluation and patient voices understanding.   Meds ordered this encounter  Medications  . Diclofenac Sodium (PENNSAID) 2 % SOLN    Sig: Place 1 application onto the skin 2 (two) times daily for 1 day.    Dispense:  8 g    Refill:  0  . Diclofenac Sodium (PENNSAID) 2 % SOLN    Sig: Place 1  application onto the skin 2 (two) times daily.    Dispense:  112 g    Refill:  2    Home Phone      [phone] Mobile          [phone]    Lab Orders  No laboratory test(s) ordered today    Imaging Orders     [place] [facility] POCT ULTRASOUND Referral Orders  No referral(s) requested today    Return in about 6 weeks (around 06/10/2018).          Gerda Diss, [place] [facility] Physician

## 2018-04-29 NOTE — Patient Instructions (Addendum)
Please perform the exercise program that we have prepared for you and gone over in detail on a daily basis.  In addition to the handout you were provided you can access your program through: [email or web address]   Your unique program code is:  [email or web address]   Pennsaid instructions: You have been given a sample/prescription for Pennsaid, a topical medication.     You are to apply this gel to your injured body part twice daily (morning and evening).   A little goes a long way so you can use about a pea-sized amount for each area.   Spread this small amount over the area into a thin film and let it dry.   Be sure that you do not rub the gel into your skin for more than 10 or 15 seconds otherwise it can irritate you skin.    Once you apply the gel, please do not put any other lotion or clothing in contact with that area for 30 minutes to allow the gel to absorb into your skin.   Some people are sensitive to the medication and can develop a sunburn-like rash.  If you have only mild symptoms it is okay to continue to use the medication but if you have any breakdown of your skin you should discontinue its use and please let us know.   If you have been written a prescription for Pennsaid, you will receive a pump bottle of this topical gel through a mail order pharmacy.  The instructions on the bottle will say to apply two pumps twice a day which may be too much gel for your particular area so use the pea-sized amount as your guide.  Instructions for Duexis, Pennsaid and Vimovo:  Your prescription will be filled through a participating [facility] mail order pharmacy.  You will receive a phone call or text from one of the participating pharmacies which can be located in any state in the [place].  You must communicate directly with them to have this medication filled.  When the pharmacy contacts you, they will need your mailing address (for shipment of the medication) andy they will need  payment information if you have a copay (typically no more than $10). If you have not heard from them 2-3 days after your appointment with Dr. Paulla Fore, contact [facility] directly at [phone].

## 2018-04-29 NOTE — Procedures (Signed)
LIMITED MSK ULTRASOUND OF Right knee Images were obtained and interpreted by myself, Teresa Coombs, DO  Images have been saved and stored to PACS system. Images obtained on: GE S7 Ultrasound machine  FINDINGS:  Patella & Patellar Tendon: Normal Quad & Quad Tendon: Slight hypoechoic change within the distal lateral quadriceps tendon Suprapatellar Pouch: Normal, no effusion Medial Joint Line: Slight bulging of the medial meniscus with slight inferior lead tracking fluid, prominent hypervascularity within the medial genicular artery Lateral Joint Line: Normal lateral meniscus Trochlea: Normal appearing cartilage Posterior knee: No appreciable Baker's cyst  IMPRESSION:  1. Small amount of right quadriceps tendinopathy 2. Slight bulge of the medial meniscus with mild hypervascular response.

## 2018-05-24 ENCOUNTER — Other Ambulatory Visit: Payer: Self-pay | Admitting: Family Medicine

## 2018-05-24 MED FILL — LEVOTHYROXINE 50 MCG TABLET: 50 | 30 days supply | Qty: 30 | Fill #0

## 2018-06-06 DIAGNOSIS — F331 Major depressive disorder, recurrent, moderate: Secondary | ICD-10-CM | POA: Diagnosis not present

## 2018-06-10 ENCOUNTER — Ambulatory Visit: Payer: BLUE CROSS/BLUE SHIELD | Admitting: Sports Medicine

## 2018-06-19 DIAGNOSIS — F331 Major depressive disorder, recurrent, moderate: Secondary | ICD-10-CM | POA: Diagnosis not present

## 2018-06-25 ENCOUNTER — Other Ambulatory Visit: Payer: Self-pay | Admitting: *Deleted

## 2018-06-25 ENCOUNTER — Encounter: Payer: Self-pay | Admitting: Family Medicine

## 2018-06-25 MED ORDER — BUPROPION HCL ER (XL) 150 MG PO TB24: 150.0000 mg | ORAL_TABLET | Freq: Every day | ORAL | 1 refills | Status: DC

## 2018-06-25 MED ORDER — LEVOTHYROXINE SODIUM 50 MCG PO TABS: 50.0000 ug | ORAL_TABLET | Freq: Every day | ORAL | 1 refills | Status: DC

## 2018-06-28 DIAGNOSIS — F331 Major depressive disorder, recurrent, moderate: Secondary | ICD-10-CM | POA: Diagnosis not present

## 2018-07-11 DIAGNOSIS — F331 Major depressive disorder, recurrent, moderate: Secondary | ICD-10-CM | POA: Diagnosis not present

## 2018-07-16 DIAGNOSIS — F331 Major depressive disorder, recurrent, moderate: Secondary | ICD-10-CM | POA: Diagnosis not present

## 2018-07-24 DIAGNOSIS — F331 Major depressive disorder, recurrent, moderate: Secondary | ICD-10-CM | POA: Diagnosis not present

## 2018-07-24 MED FILL — TRINTELLIX 10 MG TABLET: 10 | 30 days supply | Qty: 30 | Fill #0

## 2018-08-02 DIAGNOSIS — F331 Major depressive disorder, recurrent, moderate: Secondary | ICD-10-CM | POA: Diagnosis not present

## 2018-09-04 MED FILL — TRINTELLIX 10 MG TABLET: 10 | 30 days supply | Qty: 30 | Fill #1

## 2018-09-10 DIAGNOSIS — F4322 Adjustment disorder with anxiety: Secondary | ICD-10-CM | POA: Diagnosis not present

## 2018-09-17 DIAGNOSIS — F4322 Adjustment disorder with anxiety: Secondary | ICD-10-CM | POA: Diagnosis not present

## 2018-09-20 ENCOUNTER — Encounter: Payer: No Typology Code available for payment source | Admitting: Family Medicine

## 2018-09-27 DIAGNOSIS — F4322 Adjustment disorder with anxiety: Secondary | ICD-10-CM | POA: Diagnosis not present

## 2018-10-03 DIAGNOSIS — F4322 Adjustment disorder with anxiety: Secondary | ICD-10-CM | POA: Diagnosis not present

## 2018-10-10 DIAGNOSIS — F4322 Adjustment disorder with anxiety: Secondary | ICD-10-CM | POA: Diagnosis not present

## 2018-10-11 MED FILL — TRINTELLIX 10 MG TABLET: 10 | 30 days supply | Qty: 30 | Fill #2

## 2018-10-30 DIAGNOSIS — F4322 Adjustment disorder with anxiety: Secondary | ICD-10-CM | POA: Diagnosis not present

## 2018-10-31 ENCOUNTER — Encounter: Payer: BC Managed Care – PPO | Admitting: Family Medicine

## 2018-11-06 DIAGNOSIS — F4322 Adjustment disorder with anxiety: Secondary | ICD-10-CM | POA: Diagnosis not present

## 2018-11-11 DIAGNOSIS — F4322 Adjustment disorder with anxiety: Secondary | ICD-10-CM | POA: Diagnosis not present

## 2018-11-26 ENCOUNTER — Other Ambulatory Visit: Payer: Self-pay

## 2018-11-26 ENCOUNTER — Ambulatory Visit (INDEPENDENT_AMBULATORY_CARE_PROVIDER_SITE_OTHER): Payer: BC Managed Care – PPO | Admitting: Family Medicine

## 2018-11-26 ENCOUNTER — Encounter: Payer: Self-pay | Admitting: Family Medicine

## 2018-11-26 VITALS — BP 103/72 | HR 78 | Temp 98.1°F | Resp 16 | Ht 62.0 in | Wt 122.5 lb

## 2018-11-26 DIAGNOSIS — Z0000 Encounter for general adult medical examination without abnormal findings: Secondary | ICD-10-CM

## 2018-11-26 DIAGNOSIS — E039 Hypothyroidism, unspecified: Secondary | ICD-10-CM | POA: Diagnosis not present

## 2018-11-26 DIAGNOSIS — Z111 Encounter for screening for respiratory tuberculosis: Secondary | ICD-10-CM

## 2018-11-26 DIAGNOSIS — Z30433 Encounter for removal and reinsertion of intrauterine contraceptive device: Secondary | ICD-10-CM

## 2018-11-26 DIAGNOSIS — F339 Major depressive disorder, recurrent, unspecified: Secondary | ICD-10-CM | POA: Diagnosis not present

## 2018-11-26 LAB — CBC WITH DIFFERENTIAL/PLATELET
Basophils Absolute: 0 10*3/uL (ref 0.0–0.1)
Basophils Relative: 0.9 % (ref 0.0–3.0)
Eosinophils Absolute: 0.1 10*3/uL (ref 0.0–0.7)
Eosinophils Relative: 1.5 % (ref 0.0–5.0)
HCT: 44.9 % (ref 36.0–46.0)
Hemoglobin: 15.2 g/dL — ABNORMAL HIGH (ref 12.0–15.0)
Lymphocytes Relative: 31.8 % (ref 12.0–46.0)
Lymphs Abs: 1.2 10*3/uL (ref 0.7–4.0)
MCHC: 33.8 g/dL (ref 30.0–36.0)
MCV: 95.6 fl (ref 78.0–100.0)
Monocytes Absolute: 0.4 10*3/uL (ref 0.1–1.0)
Monocytes Relative: 11.2 % (ref 3.0–12.0)
Neutro Abs: 2.1 10*3/uL (ref 1.4–7.7)
Neutrophils Relative %: 54.6 % (ref 43.0–77.0)
Platelets: 222 10*3/uL (ref 150.0–400.0)
RBC: 4.7 Mil/uL (ref 3.87–5.11)
RDW: 12.2 % (ref 11.5–15.5)
WBC: 3.9 10*3/uL — ABNORMAL LOW (ref 4.0–10.5)

## 2018-11-26 LAB — BASIC METABOLIC PANEL
BUN: 13 mg/dL (ref 6–23)
CO2: 28 mEq/L (ref 19–32)
Calcium: 9.2 mg/dL (ref 8.4–10.5)
Chloride: 101 mEq/L (ref 96–112)
Creatinine, Ser: 0.64 mg/dL (ref 0.40–1.20)
GFR: 110.35 mL/min (ref >60.00)
Glucose, Bld: 83 mg/dL (ref 70–99)
Potassium: 3.9 mEq/L (ref 3.5–5.1)
Sodium: 137 mEq/L (ref 135–145)

## 2018-11-26 LAB — LIPID PANEL
Cholesterol: 164 mg/dL (ref 0–200)
HDL: 69.2 mg/dL (ref >39.00)
LDL Cholesterol: 87 mg/dL (ref 0–99)
NonHDL: 95.27
Total CHOL/HDL Ratio: 2
Triglycerides: 40 mg/dL (ref 0.0–149.0)
VLDL: 8 mg/dL (ref 0.0–40.0)

## 2018-11-26 LAB — HEPATIC FUNCTION PANEL
ALT: 11 U/L (ref 0–35)
AST: 20 U/L (ref 0–37)
Albumin: 4.2 g/dL (ref 3.5–5.2)
Alkaline Phosphatase: 49 U/L (ref 39–117)
Bilirubin, Direct: 0.1 mg/dL (ref 0.0–0.3)
Total Bilirubin: 0.6 mg/dL (ref 0.2–1.2)
Total Protein: 7 g/dL (ref 6.0–8.3)

## 2018-11-26 LAB — TSH: TSH: 1.72 u[IU]/mL (ref 0.35–4.50)

## 2018-11-26 NOTE — Assessment & Plan Note (Signed)
Currently well controlled.  No med changes.  Will follow.

## 2018-11-26 NOTE — Assessment & Plan Note (Signed)
Pt's PE WNL.  UTD on Tdap.  Will get flu at work.  GYN referral placed.  Check labs.  Anticipatory guidance provided.

## 2018-11-26 NOTE — Progress Notes (Signed)
   Subjective:    Patient ID: ANAYELY CHEESEBORO, female    DOB: 1990-11-27, 28 y.o.   MRN: [id number]  HPI CPE- UTD on Tdap, will get flu at work.  Wants GYN referral for IUD.   Review of Systems Patient reports no vision/ hearing changes, adenopathy,fever, weight change,  persistant/recurrent hoarseness , swallowing issues, chest pain, palpitations, edema, persistant/recurrent cough, hemoptysis, dyspnea (rest/exertional/paroxysmal nocturnal), gastrointestinal bleeding (melena, rectal bleeding), abdominal pain, significant heartburn, bowel changes, GU symptoms (dysuria, hematuria, incontinence), Gyn symptoms (abnormal  bleeding, pain),  syncope, focal weakness, memory loss, numbness & tingling, skin/hair/nail changes, abnormal bruising or bleeding, anxiety, or depression.     Objective:   Physical Exam General Appearance:    Alert, cooperative, no distress, appears stated age  Head:    Normocephalic, without obvious abnormality, atraumatic  Eyes:    PERRL, conjunctiva/corneas clear, EOM's intact, fundi    benign, both eyes  Ears:    Normal TM's and external ear canals, both ears  Nose:   Deferred due to COVID  Throat:   Neck:   Supple, symmetrical, trachea midline, no adenopathy;    Thyroid: no enlargement/tenderness/nodules  Back:     Symmetric, no curvature, ROM normal, no CVA tenderness  Lungs:     Clear to auscultation bilaterally, respirations unlabored  Chest Wall:    No tenderness or deformity   Heart:    Regular rate and rhythm, S1 and S2 normal, no murmur, rub   or gallop  Breast Exam:    Deferred to GYN  Abdomen:     Soft, non-tender, bowel sounds active all four quadrants,    no masses, no organomegaly  Genitalia:    Deferred to GYN  Rectal:    Extremities:   Extremities normal, atraumatic, no cyanosis or edema  Pulses:   2+ and symmetric all extremities  Skin:   Skin color, texture, turgor normal, no rashes or lesions  Lymph nodes:   Cervical, supraclavicular, and  axillary nodes normal  Neurologic:   CNII-XII intact, normal strength, sensation and reflexes    throughout          Assessment & Plan:

## 2018-11-26 NOTE — Patient Instructions (Signed)
Follow up in 1 year or as needed We'll notify you of your lab results and make any changes if needed Continue to work on healthy diet and regular exercise- you look great! Call with any questions or concerns Stay Safe!!! 

## 2018-11-26 NOTE — Assessment & Plan Note (Signed)
Chronic problem.  Currently asymptomatic.  Check labs.  Adjust meds prn  

## 2018-11-27 ENCOUNTER — Encounter: Payer: Self-pay | Admitting: General Practice

## 2018-11-27 DIAGNOSIS — F4322 Adjustment disorder with anxiety: Secondary | ICD-10-CM | POA: Diagnosis not present

## 2018-11-28 LAB — QUANTIFERON-TB GOLD PLUS
Mitogen-NIL: >10 IU/mL
NIL: 0.03 IU/mL
QuantiFERON-TB Gold Plus: NEGATIVE
TB1-NIL: 0 IU/mL
TB2-NIL: 0 IU/mL

## 2018-12-03 ENCOUNTER — Encounter: Payer: BC Managed Care – PPO | Admitting: Family Medicine

## 2018-12-05 ENCOUNTER — Other Ambulatory Visit: Payer: Self-pay | Admitting: Family Medicine

## 2018-12-10 DIAGNOSIS — F4322 Adjustment disorder with anxiety: Secondary | ICD-10-CM | POA: Diagnosis not present

## 2018-12-18 DIAGNOSIS — F4322 Adjustment disorder with anxiety: Secondary | ICD-10-CM | POA: Diagnosis not present

## 2018-12-22 ENCOUNTER — Other Ambulatory Visit: Payer: Self-pay | Admitting: Family Medicine

## 2018-12-23 DIAGNOSIS — F4322 Adjustment disorder with anxiety: Secondary | ICD-10-CM | POA: Diagnosis not present

## 2019-01-07 DIAGNOSIS — F4322 Adjustment disorder with anxiety: Secondary | ICD-10-CM | POA: Diagnosis not present

## 2019-01-08 DIAGNOSIS — F331 Major depressive disorder, recurrent, moderate: Secondary | ICD-10-CM | POA: Diagnosis not present

## 2019-01-10 ENCOUNTER — Encounter: Payer: Self-pay | Admitting: Family Medicine

## 2019-01-22 DIAGNOSIS — Z01419 Encounter for gynecological examination (general) (routine) without abnormal findings: Secondary | ICD-10-CM | POA: Diagnosis not present

## 2019-01-22 DIAGNOSIS — Z6823 Body mass index (BMI) 23.0-23.9, adult: Secondary | ICD-10-CM | POA: Diagnosis not present

## 2019-01-22 DIAGNOSIS — Z124 Encounter for screening for malignant neoplasm of cervix: Secondary | ICD-10-CM | POA: Diagnosis not present

## 2019-01-22 DIAGNOSIS — Z1389 Encounter for screening for other disorder: Secondary | ICD-10-CM | POA: Diagnosis not present

## 2019-03-11 DIAGNOSIS — Z30430 Encounter for insertion of intrauterine contraceptive device: Secondary | ICD-10-CM | POA: Diagnosis not present

## 2019-03-11 DIAGNOSIS — Z30432 Encounter for removal of intrauterine contraceptive device: Secondary | ICD-10-CM | POA: Diagnosis not present

## 2019-03-11 DIAGNOSIS — Z3202 Encounter for pregnancy test, result negative: Secondary | ICD-10-CM | POA: Diagnosis not present

## 2019-03-11 DIAGNOSIS — F4322 Adjustment disorder with anxiety: Secondary | ICD-10-CM | POA: Diagnosis not present

## 2019-03-31 DIAGNOSIS — F4322 Adjustment disorder with anxiety: Secondary | ICD-10-CM | POA: Diagnosis not present

## 2019-04-09 DIAGNOSIS — F3341 Major depressive disorder, recurrent, in partial remission: Secondary | ICD-10-CM | POA: Diagnosis not present

## 2019-04-09 DIAGNOSIS — F4322 Adjustment disorder with anxiety: Secondary | ICD-10-CM | POA: Diagnosis not present

## 2019-04-09 DIAGNOSIS — F411 Generalized anxiety disorder: Secondary | ICD-10-CM | POA: Diagnosis not present

## 2019-04-23 DIAGNOSIS — F4322 Adjustment disorder with anxiety: Secondary | ICD-10-CM | POA: Diagnosis not present

## 2019-04-24 DIAGNOSIS — Z30431 Encounter for routine checking of intrauterine contraceptive device: Secondary | ICD-10-CM | POA: Diagnosis not present

## 2019-05-07 DIAGNOSIS — F4322 Adjustment disorder with anxiety: Secondary | ICD-10-CM | POA: Diagnosis not present

## 2019-05-28 DIAGNOSIS — F3341 Major depressive disorder, recurrent, in partial remission: Secondary | ICD-10-CM | POA: Diagnosis not present

## 2019-05-28 DIAGNOSIS — F411 Generalized anxiety disorder: Secondary | ICD-10-CM | POA: Diagnosis not present

## 2019-06-04 DIAGNOSIS — F4322 Adjustment disorder with anxiety: Secondary | ICD-10-CM | POA: Diagnosis not present

## 2019-07-02 DIAGNOSIS — F4322 Adjustment disorder with anxiety: Secondary | ICD-10-CM | POA: Diagnosis not present

## 2019-08-11 DIAGNOSIS — F4322 Adjustment disorder with anxiety: Secondary | ICD-10-CM | POA: Diagnosis not present

## 2019-09-18 DIAGNOSIS — F4322 Adjustment disorder with anxiety: Secondary | ICD-10-CM | POA: Diagnosis not present

## 2019-10-21 DIAGNOSIS — F4322 Adjustment disorder with anxiety: Secondary | ICD-10-CM | POA: Diagnosis not present

## 2019-11-27 ENCOUNTER — Other Ambulatory Visit: Payer: Self-pay | Admitting: Family Medicine

## 2019-12-02 ENCOUNTER — Ambulatory Visit (INDEPENDENT_AMBULATORY_CARE_PROVIDER_SITE_OTHER): Payer: BC Managed Care – PPO | Admitting: Family Medicine

## 2019-12-02 ENCOUNTER — Other Ambulatory Visit: Payer: Self-pay

## 2019-12-02 ENCOUNTER — Encounter: Payer: Self-pay | Admitting: Family Medicine

## 2019-12-02 VITALS — BP 102/70 | HR 73 | Temp 97.9°F | Resp 16 | Ht 62.0 in | Wt 123.5 lb

## 2019-12-02 DIAGNOSIS — F4322 Adjustment disorder with anxiety: Secondary | ICD-10-CM | POA: Diagnosis not present

## 2019-12-02 DIAGNOSIS — E039 Hypothyroidism, unspecified: Secondary | ICD-10-CM

## 2019-12-02 DIAGNOSIS — Z0000 Encounter for general adult medical examination without abnormal findings: Secondary | ICD-10-CM | POA: Diagnosis not present

## 2019-12-02 LAB — LIPID PANEL
Cholesterol: 157 mg/dL (ref 0–200)
HDL: 61.8 mg/dL (ref >39.00)
LDL Cholesterol: 84 mg/dL (ref 0–99)
NonHDL: 95.51
Total CHOL/HDL Ratio: 3
Triglycerides: 59 mg/dL (ref 0.0–149.0)
VLDL: 11.8 mg/dL (ref 0.0–40.0)

## 2019-12-02 LAB — CBC WITH DIFFERENTIAL/PLATELET
Basophils Absolute: 0 10*3/uL (ref 0.0–0.1)
Basophils Relative: 0.8 % (ref 0.0–3.0)
Eosinophils Absolute: 0.1 10*3/uL (ref 0.0–0.7)
Eosinophils Relative: 1.7 % (ref 0.0–5.0)
HCT: 43.5 % (ref 36.0–46.0)
Hemoglobin: 14.6 g/dL (ref 12.0–15.0)
Lymphocytes Relative: 27.4 % (ref 12.0–46.0)
Lymphs Abs: 1.6 10*3/uL (ref 0.7–4.0)
MCHC: 33.6 g/dL (ref 30.0–36.0)
MCV: 96 fl (ref 78.0–100.0)
Monocytes Absolute: 0.7 10*3/uL (ref 0.1–1.0)
Monocytes Relative: 11.8 % (ref 3.0–12.0)
Neutro Abs: 3.3 10*3/uL (ref 1.4–7.7)
Neutrophils Relative %: 58.3 % (ref 43.0–77.0)
Platelets: 205 10*3/uL (ref 150.0–400.0)
RBC: 4.52 Mil/uL (ref 3.87–5.11)
RDW: 12.6 % (ref 11.5–15.5)
WBC: 5.7 10*3/uL (ref 4.0–10.5)

## 2019-12-02 LAB — TSH: TSH: 4.06 u[IU]/mL (ref 0.35–4.50)

## 2019-12-02 LAB — BASIC METABOLIC PANEL
BUN: 12 mg/dL (ref 6–23)
CO2: 28 mEq/L (ref 19–32)
Calcium: 9 mg/dL (ref 8.4–10.5)
Chloride: 102 mEq/L (ref 96–112)
Creatinine, Ser: 0.67 mg/dL (ref 0.40–1.20)
GFR: 103.92 mL/min (ref >60.00)
Glucose, Bld: 82 mg/dL (ref 70–99)
Potassium: 3.4 mEq/L — ABNORMAL LOW (ref 3.5–5.1)
Sodium: 138 mEq/L (ref 135–145)

## 2019-12-02 LAB — HEPATIC FUNCTION PANEL
ALT: 10 U/L (ref 0–35)
AST: 20 U/L (ref 0–37)
Albumin: 4.5 g/dL (ref 3.5–5.2)
Alkaline Phosphatase: 49 U/L (ref 39–117)
Bilirubin, Direct: 0.1 mg/dL (ref 0.0–0.3)
Total Bilirubin: 0.6 mg/dL (ref 0.2–1.2)
Total Protein: 7.1 g/dL (ref 6.0–8.3)

## 2019-12-02 NOTE — Progress Notes (Signed)
   Subjective:    Patient ID: Tara Reyes, female    DOB: 1990/09/11, 29 y.o.   MRN: [id number]  HPI CPE- UTD on COVID vaccines, Tdap, due for flu- will get at work.  UTD on GYN.  No concerns today.  Reviewed past medical, surgical, family and social histories.    Review of Systems Patient reports no vision/ hearing changes, adenopathy,fever, weight change,  persistant/recurrent hoarseness , swallowing issues, chest pain, palpitations, edema, persistant/recurrent cough, hemoptysis, dyspnea (rest/exertional/paroxysmal nocturnal), gastrointestinal bleeding (melena, rectal bleeding), abdominal pain, significant heartburn, bowel changes, GU symptoms (dysuria, hematuria, incontinence), Gyn symptoms (abnormal  bleeding, pain),  syncope, focal weakness, memory loss, numbness & tingling, skin/hair/nail changes, abnormal bruising or bleeding, anxiety, or depression.   This visit occurred during the SARS-CoV-2 public health emergency.  Safety protocols were in place, including screening questions prior to the visit, additional usage of staff PPE, and extensive cleaning of exam room while observing appropriate contact time as indicated for disinfecting solutions.       Objective:   Physical Exam General Appearance:    Alert, cooperative, no distress, appears stated age  Head:    Normocephalic, without obvious abnormality, atraumatic  Eyes:    PERRL, conjunctiva/corneas clear, EOM's intact, fundi    benign, both eyes  Ears:    Normal TM's and external ear canals, both ears  Nose:   Deferred due to COVID  Throat:   Neck:   Supple, symmetrical, trachea midline, no adenopathy;    Thyroid: no enlargement/tenderness/nodules  Back:     Symmetric, no curvature, ROM normal, no CVA tenderness  Lungs:     Clear to auscultation bilaterally, respirations unlabored  Chest Wall:    No tenderness or deformity   Heart:    Regular rate and rhythm, S1 and S2 normal, no murmur, rub   or gallop  Breast Exam:     Deferred to GYN  Abdomen:     Soft, non-tender, bowel sounds active all four quadrants,    no masses, no organomegaly  Genitalia:    Deferred to GYN  Rectal:    Extremities:   Extremities normal, atraumatic, no cyanosis or edema  Pulses:   2+ and symmetric all extremities  Skin:   Skin color, texture, turgor normal, no rashes or lesions  Lymph nodes:   Cervical, supraclavicular, and axillary nodes normal  Neurologic:   CNII-XII intact, normal strength, sensation and reflexes    throughout          Assessment & Plan:

## 2019-12-02 NOTE — Assessment & Plan Note (Signed)
Pt's PE WNL.  UTD on GYN, immunizations (will get flu shot through work).  Check labs.  Anticipatory guidance provided.

## 2019-12-02 NOTE — Patient Instructions (Signed)
Follow up in 1 year or as needed We'll notify you of your lab results and make any changes if needed Keep up the good work!  You look great! Call with any questions or concerns Stay Safe!  Stay Healthy!!! 

## 2019-12-02 NOTE — Assessment & Plan Note (Signed)
Chronic problem.  Currently asymptomatic.  Check labs.  Adjust meds prn  

## 2019-12-03 ENCOUNTER — Encounter: Payer: Self-pay | Admitting: General Practice

## 2019-12-22 ENCOUNTER — Encounter: Payer: Self-pay | Admitting: Family Medicine

## 2020-01-23 DIAGNOSIS — F4322 Adjustment disorder with anxiety: Secondary | ICD-10-CM | POA: Diagnosis not present

## 2020-03-04 DIAGNOSIS — F4322 Adjustment disorder with anxiety: Secondary | ICD-10-CM | POA: Diagnosis not present

## 2020-04-09 DIAGNOSIS — F4322 Adjustment disorder with anxiety: Secondary | ICD-10-CM | POA: Diagnosis not present

## 2020-05-15 ENCOUNTER — Encounter: Payer: Self-pay | Admitting: Family Medicine

## 2020-05-15 DIAGNOSIS — F339 Major depressive disorder, recurrent, unspecified: Secondary | ICD-10-CM

## 2020-05-17 MED ORDER — BUPROPION HCL ER (XL) 150 MG PO TB24: 150.0000 mg | ORAL_TABLET | Freq: Every day | ORAL | 3 refills | Status: DC

## 2020-06-29 DIAGNOSIS — F4322 Adjustment disorder with anxiety: Secondary | ICD-10-CM | POA: Diagnosis not present

## 2020-07-12 DIAGNOSIS — F4322 Adjustment disorder with anxiety: Secondary | ICD-10-CM | POA: Diagnosis not present

## 2020-08-05 DIAGNOSIS — F4322 Adjustment disorder with anxiety: Secondary | ICD-10-CM | POA: Diagnosis not present

## 2020-09-03 DIAGNOSIS — F4322 Adjustment disorder with anxiety: Secondary | ICD-10-CM | POA: Diagnosis not present

## 2020-09-15 DIAGNOSIS — F4322 Adjustment disorder with anxiety: Secondary | ICD-10-CM | POA: Diagnosis not present

## 2020-09-22 ENCOUNTER — Encounter: Payer: Self-pay | Admitting: *Deleted

## 2020-10-01 DIAGNOSIS — F4322 Adjustment disorder with anxiety: Secondary | ICD-10-CM | POA: Diagnosis not present

## 2020-10-20 DIAGNOSIS — F4322 Adjustment disorder with anxiety: Secondary | ICD-10-CM | POA: Diagnosis not present

## 2020-11-02 DIAGNOSIS — F4322 Adjustment disorder with anxiety: Secondary | ICD-10-CM | POA: Diagnosis not present

## 2020-11-16 DIAGNOSIS — F4322 Adjustment disorder with anxiety: Secondary | ICD-10-CM | POA: Diagnosis not present

## 2020-11-22 ENCOUNTER — Other Ambulatory Visit: Payer: Self-pay | Admitting: Family Medicine

## 2020-12-03 DIAGNOSIS — F4322 Adjustment disorder with anxiety: Secondary | ICD-10-CM | POA: Diagnosis not present

## 2020-12-16 DIAGNOSIS — F4322 Adjustment disorder with anxiety: Secondary | ICD-10-CM | POA: Diagnosis not present

## 2020-12-29 DIAGNOSIS — F4322 Adjustment disorder with anxiety: Secondary | ICD-10-CM | POA: Diagnosis not present

## 2021-01-13 DIAGNOSIS — F4322 Adjustment disorder with anxiety: Secondary | ICD-10-CM | POA: Diagnosis not present

## 2021-01-31 DIAGNOSIS — F4322 Adjustment disorder with anxiety: Secondary | ICD-10-CM | POA: Diagnosis not present

## 2021-02-10 ENCOUNTER — Ambulatory Visit (INDEPENDENT_AMBULATORY_CARE_PROVIDER_SITE_OTHER): Payer: BC Managed Care – PPO | Admitting: Family Medicine

## 2021-02-10 ENCOUNTER — Encounter: Payer: Self-pay | Admitting: Family Medicine

## 2021-02-10 VITALS — BP 104/64 | HR 68 | Temp 97.9°F | Resp 18 | Ht 62.0 in | Wt 126.6 lb

## 2021-02-10 DIAGNOSIS — E039 Hypothyroidism, unspecified: Secondary | ICD-10-CM

## 2021-02-10 DIAGNOSIS — Z0000 Encounter for general adult medical examination without abnormal findings: Secondary | ICD-10-CM | POA: Diagnosis not present

## 2021-02-10 DIAGNOSIS — N6321 Unspecified lump in the left breast, upper outer quadrant: Secondary | ICD-10-CM

## 2021-02-10 DIAGNOSIS — F339 Major depressive disorder, recurrent, unspecified: Secondary | ICD-10-CM | POA: Diagnosis not present

## 2021-02-10 LAB — TSH: TSH: 3.05 u[IU]/mL (ref 0.35–5.50)

## 2021-02-10 LAB — CBC WITH DIFFERENTIAL/PLATELET
Basophils Absolute: 0 10*3/uL (ref 0.0–0.1)
Basophils Relative: 0.8 % (ref 0.0–3.0)
Eosinophils Absolute: 0.1 10*3/uL (ref 0.0–0.7)
Eosinophils Relative: 2.3 % (ref 0.0–5.0)
HCT: 41.1 % (ref 36.0–46.0)
Hemoglobin: 13.4 g/dL (ref 12.0–15.0)
Lymphocytes Relative: 30.9 % (ref 12.0–46.0)
Lymphs Abs: 1.2 10*3/uL (ref 0.7–4.0)
MCHC: 32.6 g/dL (ref 30.0–36.0)
MCV: 92.1 fl (ref 78.0–100.0)
Monocytes Absolute: 0.5 10*3/uL (ref 0.1–1.0)
Monocytes Relative: 13.4 % — ABNORMAL HIGH (ref 3.0–12.0)
Neutro Abs: 2.1 10*3/uL (ref 1.4–7.7)
Neutrophils Relative %: 52.6 % (ref 43.0–77.0)
Platelets: 201 10*3/uL (ref 150.0–400.0)
RBC: 4.46 Mil/uL (ref 3.87–5.11)
RDW: 13.9 % (ref 11.5–15.5)
WBC: 4 10*3/uL (ref 4.0–10.5)

## 2021-02-10 LAB — HEPATIC FUNCTION PANEL
ALT: 10 U/L (ref 0–35)
AST: 18 U/L (ref 0–37)
Albumin: 4.2 g/dL (ref 3.5–5.2)
Alkaline Phosphatase: 48 U/L (ref 39–117)
Bilirubin, Direct: 0.1 mg/dL (ref 0.0–0.3)
Total Bilirubin: 0.5 mg/dL (ref 0.2–1.2)
Total Protein: 6.8 g/dL (ref 6.0–8.3)

## 2021-02-10 LAB — LIPID PANEL
Cholesterol: 161 mg/dL (ref 0–200)
HDL: 65.1 mg/dL (ref >39.00)
LDL Cholesterol: 88 mg/dL (ref 0–99)
NonHDL: 95.46
Total CHOL/HDL Ratio: 2
Triglycerides: 35 mg/dL (ref 0.0–149.0)
VLDL: 7 mg/dL (ref 0.0–40.0)

## 2021-02-10 LAB — BASIC METABOLIC PANEL
BUN: 15 mg/dL (ref 6–23)
CO2: 26 mEq/L (ref 19–32)
Calcium: 8.8 mg/dL (ref 8.4–10.5)
Chloride: 104 mEq/L (ref 96–112)
Creatinine, Ser: 0.67 mg/dL (ref 0.40–1.20)
GFR: 117.31 mL/min (ref >60.00)
Glucose, Bld: 85 mg/dL (ref 70–99)
Potassium: 4 mEq/L (ref 3.5–5.1)
Sodium: 137 mEq/L (ref 135–145)

## 2021-02-10 NOTE — Progress Notes (Signed)
   Subjective:    Patient ID: Tara Reyes, female    DOB: 1990/07/30, 30 y.o.   MRN: [id number]  HPI CPE- UTD on pap, Tdap, flu.  Health Maintenance  Topic Date Due   HIV Screening  Never done   Hepatitis C Screening  Never done   COVID-19 Vaccine (3 - Booster for Pfizer series) 02/26/2021 (Originally 05/31/2019)   PAP SMEAR-Modifier  12/01/2021   TETANUS/TDAP  05/17/2026   INFLUENZA VACCINE  Completed   HPV VACCINES  Completed   Pneumococcal Vaccine 55-8 Years old  Aged Out      Review of Systems Patient reports no vision/ hearing changes, adenopathy,fever, weight change,  persistant/recurrent hoarseness , swallowing issues, chest pain, palpitations, edema, persistant/recurrent cough, hemoptysis, dyspnea (rest/exertional/paroxysmal nocturnal), gastrointestinal bleeding (melena, rectal bleeding), abdominal pain, significant heartburn, bowel changes, GU symptoms (dysuria, hematuria, incontinence), Gyn symptoms (abnormal  bleeding, pain),  syncope, focal weakness, memory loss, numbness & tingling, skin/hair/nail changes, abnormal bruising or bleeding, anxiety, or depression.   + breast lump- occurred just prior to menses and resolved within a few days.  No TTP.  L lateral breast.    This visit occurred during the SARS-CoV-2 public health emergency.  Safety protocols were in place, including screening questions prior to the visit, additional usage of staff PPE, and extensive cleaning of exam room while observing appropriate contact time as indicated for disinfecting solutions.      Objective:   Physical Exam General Appearance:    Alert, cooperative, no distress, appears stated age  Head:    Normocephalic, without obvious abnormality, atraumatic  Eyes:    PERRL, conjunctiva/corneas clear, EOM's intact, fundi    benign, both eyes  Ears:    Normal TM's and external ear canals, both ears  Nose:   Deferred due to COVID  Throat:   Neck:   Supple, symmetrical, trachea midline, no  adenopathy;    Thyroid: no enlargement/tenderness/nodules  Back:     Symmetric, no curvature, ROM normal, no CVA tenderness  Lungs:     Clear to auscultation bilaterally, respirations unlabored  Chest Wall:    No tenderness or deformity   Heart:    Regular rate and rhythm, S1 and S2 normal, no murmur, rub   or gallop  Breast Exam:    Deferred to GYN  Abdomen:     Soft, non-tender, bowel sounds active all four quadrants,    no masses, no organomegaly  Genitalia:    Deferred to GYN  Rectal:    Extremities:   Extremities normal, atraumatic, no cyanosis or edema  Pulses:   2+ and symmetric all extremities  Skin:   Skin color, texture, turgor normal, no rashes or lesions  Lymph nodes:   Cervical, supraclavicular, and axillary nodes normal  Neurologic:   CNII-XII intact, normal strength, sensation and reflexes    throughout          Assessment & Plan:

## 2021-02-10 NOTE — Assessment & Plan Note (Signed)
Pt's PE WNL.  UTD on immunizations, pap.  Check labs.  Anticipatory guidance provided.

## 2021-02-10 NOTE — Patient Instructions (Signed)
Follow up in 1 year or as needed We'll notify you of your lab results and make any changes if needed Keep up the good work on healthy diet and regular exercise- you look great! We'll call you with your [place] appt Call with any questions or concerns Stay Safe!! Stay Healthy!! Happy Holidays!!

## 2021-02-10 NOTE — Assessment & Plan Note (Signed)
Chronic problem.  Currently asymptomatic.  Check labs.  Adjust meds prn  

## 2021-02-10 NOTE — Assessment & Plan Note (Signed)
PHQ=0.  No changes at this time

## 2021-02-14 ENCOUNTER — Other Ambulatory Visit: Payer: Self-pay | Admitting: Family Medicine

## 2021-02-14 DIAGNOSIS — N6321 Unspecified lump in the left breast, upper outer quadrant: Secondary | ICD-10-CM

## 2021-02-15 DIAGNOSIS — F4322 Adjustment disorder with anxiety: Secondary | ICD-10-CM | POA: Diagnosis not present

## 2021-02-21 ENCOUNTER — Other Ambulatory Visit: Payer: Self-pay | Admitting: Family Medicine

## 2021-02-28 DIAGNOSIS — F4322 Adjustment disorder with anxiety: Secondary | ICD-10-CM | POA: Diagnosis not present

## 2021-03-08 ENCOUNTER — Ambulatory Visit
Admission: RE | Admit: 2021-03-08 | Discharge: 2021-03-08 | Disposition: A | Discharge status: Home / Self Care | Payer: BC Managed Care – PPO | Source: Ambulatory Visit | Attending: Family Medicine | Admitting: Family Medicine

## 2021-03-08 DIAGNOSIS — N6321 Unspecified lump in the left breast, upper outer quadrant: Secondary | ICD-10-CM

## 2021-03-08 DIAGNOSIS — R922 Inconclusive mammogram: Secondary | ICD-10-CM | POA: Diagnosis not present

## 2021-03-15 DIAGNOSIS — F4322 Adjustment disorder with anxiety: Secondary | ICD-10-CM | POA: Diagnosis not present

## 2021-03-23 DIAGNOSIS — D2262 Melanocytic nevi of left upper limb, including shoulder: Secondary | ICD-10-CM | POA: Diagnosis not present

## 2021-03-23 DIAGNOSIS — D2271 Melanocytic nevi of right lower limb, including hip: Secondary | ICD-10-CM | POA: Diagnosis not present

## 2021-03-23 DIAGNOSIS — L578 Other skin changes due to chronic exposure to nonionizing radiation: Secondary | ICD-10-CM | POA: Diagnosis not present

## 2021-03-23 DIAGNOSIS — D2261 Melanocytic nevi of right upper limb, including shoulder: Secondary | ICD-10-CM | POA: Diagnosis not present

## 2021-03-28 DIAGNOSIS — F4322 Adjustment disorder with anxiety: Secondary | ICD-10-CM | POA: Diagnosis not present

## 2021-04-14 DIAGNOSIS — F4322 Adjustment disorder with anxiety: Secondary | ICD-10-CM | POA: Diagnosis not present

## 2021-04-28 DIAGNOSIS — F4322 Adjustment disorder with anxiety: Secondary | ICD-10-CM | POA: Diagnosis not present

## 2021-05-13 DIAGNOSIS — F4322 Adjustment disorder with anxiety: Secondary | ICD-10-CM | POA: Diagnosis not present

## 2021-05-26 DIAGNOSIS — F4322 Adjustment disorder with anxiety: Secondary | ICD-10-CM | POA: Diagnosis not present

## 2021-06-08 DIAGNOSIS — F4322 Adjustment disorder with anxiety: Secondary | ICD-10-CM | POA: Diagnosis not present

## 2021-06-20 DIAGNOSIS — F4322 Adjustment disorder with anxiety: Secondary | ICD-10-CM | POA: Diagnosis not present

## 2021-07-07 DIAGNOSIS — F4322 Adjustment disorder with anxiety: Secondary | ICD-10-CM | POA: Diagnosis not present

## 2022-02-13 ENCOUNTER — Encounter: Payer: Self-pay | Admitting: Family Medicine

## 2022-02-13 ENCOUNTER — Ambulatory Visit (INDEPENDENT_AMBULATORY_CARE_PROVIDER_SITE_OTHER): Payer: BC Managed Care – PPO | Admitting: Family Medicine

## 2022-02-13 VITALS — BP 120/82 | HR 61 | Temp 98.2°F | Resp 16 | Ht 62.0 in | Wt 141.0 lb

## 2022-02-13 DIAGNOSIS — F339 Major depressive disorder, recurrent, unspecified: Secondary | ICD-10-CM | POA: Diagnosis not present

## 2022-02-13 DIAGNOSIS — E039 Hypothyroidism, unspecified: Secondary | ICD-10-CM | POA: Diagnosis not present

## 2022-02-13 DIAGNOSIS — Z0000 Encounter for general adult medical examination without abnormal findings: Secondary | ICD-10-CM | POA: Diagnosis not present

## 2022-02-13 LAB — LIPID PANEL
Cholesterol: 180 mg/dL (ref 0–200)
HDL: 69.8 mg/dL (ref >39.00)
LDL Cholesterol: 103 mg/dL — ABNORMAL HIGH (ref 0–99)
NonHDL: 110.08
Total CHOL/HDL Ratio: 3
Triglycerides: 33 mg/dL (ref 0.0–149.0)
VLDL: 6.6 mg/dL (ref 0.0–40.0)

## 2022-02-13 LAB — BASIC METABOLIC PANEL
BUN: 15 mg/dL (ref 6–23)
CO2: 28 mEq/L (ref 19–32)
Calcium: 9.1 mg/dL (ref 8.4–10.5)
Chloride: 103 mEq/L (ref 96–112)
Creatinine, Ser: 0.65 mg/dL (ref 0.40–1.20)
GFR: 117.34 mL/min (ref >60.00)
Glucose, Bld: 94 mg/dL (ref 70–99)
Potassium: 3.5 mEq/L (ref 3.5–5.1)
Sodium: 138 mEq/L (ref 135–145)

## 2022-02-13 LAB — CBC WITH DIFFERENTIAL/PLATELET
Basophils Absolute: 0.1 10*3/uL (ref 0.0–0.1)
Basophils Relative: 1 % (ref 0.0–3.0)
Eosinophils Absolute: 0.1 10*3/uL (ref 0.0–0.7)
Eosinophils Relative: 2.2 % (ref 0.0–5.0)
HCT: 41 % (ref 36.0–46.0)
Hemoglobin: 13.3 g/dL (ref 12.0–15.0)
Lymphocytes Relative: 35.4 % (ref 12.0–46.0)
Lymphs Abs: 1.8 10*3/uL (ref 0.7–4.0)
MCHC: 32.6 g/dL (ref 30.0–36.0)
MCV: 88.2 fl (ref 78.0–100.0)
Monocytes Absolute: 0.7 10*3/uL (ref 0.1–1.0)
Monocytes Relative: 13.4 % — ABNORMAL HIGH (ref 3.0–12.0)
Neutro Abs: 2.4 10*3/uL (ref 1.4–7.7)
Neutrophils Relative %: 48 % (ref 43.0–77.0)
Platelets: 283 10*3/uL (ref 150.0–400.0)
RBC: 4.64 Mil/uL (ref 3.87–5.11)
RDW: 13.7 % (ref 11.5–15.5)
WBC: 5 10*3/uL (ref 4.0–10.5)

## 2022-02-13 LAB — HEPATIC FUNCTION PANEL
ALT: 13 U/L (ref 0–35)
AST: 22 U/L (ref 0–37)
Albumin: 4.2 g/dL (ref 3.5–5.2)
Alkaline Phosphatase: 55 U/L (ref 39–117)
Bilirubin, Direct: 0.1 mg/dL (ref 0.0–0.3)
Total Bilirubin: 0.4 mg/dL (ref 0.2–1.2)
Total Protein: 7.4 g/dL (ref 6.0–8.3)

## 2022-02-13 LAB — TSH: TSH: 5.04 u[IU]/mL (ref 0.35–5.50)

## 2022-02-13 MED ORDER — FLUOXETINE HCL 20 MG PO CAPS: 20.0000 mg | ORAL_CAPSULE | Freq: Every day | ORAL | 1 refills | Status: DC

## 2022-02-13 NOTE — Progress Notes (Signed)
   Subjective:    Patient ID: Tara Reyes, female    DOB: [birth year], 31 y.o.   MRN: [id number]  HPI CPE- due for pap, UTD on Tdap, flu  Patient Care Team    Relationship Specialty Notifications Start End  Midge Minium, MD PCP - General Family Medicine  08/30/15    Comment: Charlett Blake, MD  Family Medicine  08/30/15    Comment: Delon Sacramento, Bonnee Quin, DO Consulting Physician Obstetrics and Gynecology  02/13/22      Health Maintenance  Topic Date Due   PAP SMEAR-Modifier  12/01/2021   COVID-19 Vaccine (3 - Pfizer series) 03/01/2022 (Originally 05/31/2019)   INFLUENZA VACCINE  Completed   HPV VACCINES  Completed   Hepatitis C Screening  Discontinued   HIV Screening  Discontinued     Review of Systems Patient reports no vision/ hearing changes, adenopathy,fever, weight change,  persistant/recurrent hoarseness , swallowing issues, chest pain, palpitations, edema, persistant/recurrent cough, hemoptysis, dyspnea (rest/exertional/paroxysmal nocturnal), gastrointestinal bleeding (melena, rectal bleeding), abdominal pain, significant heartburn, bowel changes, GU symptoms (dysuria, hematuria, incontinence), Gyn symptoms (abnormal  bleeding, pain),  syncope, focal weakness, memory loss, numbness & tingling, skin/hair/nail changes, abnormal bruising or bleeding.  + depression- deteriorated.  Stopped Wellbutrin b/c it was not working.  has been on Wellbutrin, Zoloft, Celexa, and Trintellix.  Trintellix worked but was much too expensive.      Objective:   Physical Exam General Appearance:    Alert, cooperative, no distress, appears stated age  Head:    Normocephalic, without obvious abnormality, atraumatic  Eyes:    PERRL, conjunctiva/corneas clear, EOM's intact both eyes  Ears:    Normal TM's and external ear canals, both ears  Nose:   Nares normal, septum midline, mucosa normal, no drainage    or sinus tenderness  Throat:   Lips, mucosa, and tongue normal; teeth  and gums normal  Neck:   Supple, symmetrical, trachea midline, no adenopathy;    Thyroid: no enlargement/tenderness/nodules  Back:     Symmetric, no curvature, ROM normal, no CVA tenderness  Lungs:     Clear to auscultation bilaterally, respirations unlabored  Chest Wall:    No tenderness or deformity   Heart:    Regular rate and rhythm, S1 and S2 normal, no murmur, rub   or gallop  Breast Exam:    Deferred to GYN  Abdomen:     Soft, non-tender, bowel sounds active all four quadrants,    no masses, no organomegaly  Genitalia:    Deferred to GYN  Rectal:    Extremities:   Extremities normal, atraumatic, no cyanosis or edema  Pulses:   2+ and symmetric all extremities  Skin:   Skin color, texture, turgor normal, no rashes or lesions  Lymph nodes:   Cervical, supraclavicular, and axillary nodes normal  Neurologic:   CNII-XII intact, normal strength, sensation and reflexes    throughout          Assessment & Plan:

## 2022-02-13 NOTE — Patient Instructions (Signed)
Follow up in 6 weeks to recheck mood We'll notify you of your lab results and make any changes if needed Continue to work on healthy diet and regular exercise- you can do it!! START the Fluoxetine once daily Call with any questions or concerns Stay Safe!  Stay Healthy! Happy Holidays!!

## 2022-02-13 NOTE — Assessment & Plan Note (Signed)
Pt's PE WNL.  UTD on Tdap, flu.  Due for pap- pt to schedule.  Check labs.  Anticipatory guidance provided.

## 2022-02-13 NOTE — Assessment & Plan Note (Signed)
Deteriorated.  Pt stopped her Wellbutrin a few months ago b/c she didn't feel it was effective.  Thought the Trintellix was helpful but this isn't on formulary and nearly $1200/month.  Will start Fluoxetine '20mg'$  daily and monitor closely for improvement.  Pt expressed understanding and is in agreement w/ plan.

## 2022-02-14 ENCOUNTER — Telehealth: Payer: Self-pay

## 2022-02-14 NOTE — Telephone Encounter (Signed)
Informed pt of lab results  

## 2022-02-14 NOTE — Telephone Encounter (Signed)
-----   Message from Midge Minium, MD sent at 02/14/2022  7:28 AM EST ----- Labs look great!  No changes at this time

## 2022-02-15 ENCOUNTER — Other Ambulatory Visit: Payer: Self-pay | Admitting: Family Medicine

## 2022-03-29 ENCOUNTER — Telehealth (INDEPENDENT_AMBULATORY_CARE_PROVIDER_SITE_OTHER): Payer: BC Managed Care – PPO | Admitting: Family Medicine

## 2022-03-29 ENCOUNTER — Encounter: Payer: Self-pay | Admitting: Family Medicine

## 2022-03-29 DIAGNOSIS — D2271 Melanocytic nevi of right lower limb, including hip: Secondary | ICD-10-CM | POA: Diagnosis not present

## 2022-03-29 DIAGNOSIS — F339 Major depressive disorder, recurrent, unspecified: Secondary | ICD-10-CM | POA: Diagnosis not present

## 2022-03-29 DIAGNOSIS — D485 Neoplasm of uncertain behavior of skin: Secondary | ICD-10-CM | POA: Diagnosis not present

## 2022-03-29 NOTE — Progress Notes (Signed)
   Virtual Visit via Video   I connected with patient on 03/29/22 at  8:20 AM EST by a video enabled telemedicine application and verified that I am speaking with the correct person using two identifiers.  Location patient: Home Location provider: [facility] Office Persons participating in the virtual visit: Patient, Provider, [place] Marcille Blanco C)  I discussed the limitations of evaluation and management by telemedicine and the availability of in person appointments. The patient expressed understanding and agreed to proceed.  Subjective:   HPI:   Depression- at last visit we stopped Wellbutrin and switched to Fluoxetine '20mg'$  daily.  'it hasn't made anything worse'.  Pt reports 'maybe feeling a little bit better'.  Still stuck in a difficult job situation but is looking for a way to remedy that.  ROS:   See pertinent positives and negatives per HPI.  Patient Active Problem List   Diagnosis Date Noted   Right knee pain 04/29/2018   Hypothyroid 09/26/2017   Depression, recurrent ([place]) 08/09/2017   Physical exam 08/30/2015    Social History   Tobacco Use   Smoking status: Never   Smokeless tobacco: Never  Substance Use Topics   Alcohol use: Yes    Alcohol/week: 0.0 standard drinks of alcohol    Comment: social    Current Outpatient Medications:    FLUoxetine (PROZAC) 20 MG capsule, Take 1 capsule (20 mg total) by mouth daily., Disp: 90 capsule, Rfl: 1   levonorgestrel (MIRENA) 20 MCG/24HR IUD, 1 each by Intrauterine route once., Disp: , Rfl:    levothyroxine (SYNTHROID) 50 MCG tablet, TAKE 1 TABLET DAILY (NEED APPOINTMENT TO CONTINUE REFILLS), Disp: 90 tablet, Rfl: 3  Allergies  Allergen Reactions   Other Other (See Comments)    Sensitive to Vicryl sutures   Penicillins Hives   Other     Vicryl Suture   Penicillins Hives    Objective:   There were no vitals taken for this visit. AAOx3, NAD NCAT, EOMI No obvious CN deficits Coloring WNL Pt is able to  speak clearly, coherently without shortness of breath or increased work of breathing.  Thought process is linear.  Mood is appropriate.   Assessment and Plan:   Depression- ongoing.  Pt reports no side effects from medication and feels that things may be slightly better since starting the Prozac 4 weeks ago.  Will increase to '40mg'$  daily and she will message me in 4-6 weeks to determine if the higher dose is helpful.  Pt expressed understanding and is in agreement w/ plan.    Annye Asa, MD 03/29/2022

## 2022-04-03 DIAGNOSIS — F331 Major depressive disorder, recurrent, moderate: Secondary | ICD-10-CM | POA: Diagnosis not present

## 2022-04-12 DIAGNOSIS — F331 Major depressive disorder, recurrent, moderate: Secondary | ICD-10-CM | POA: Diagnosis not present

## 2022-04-17 DIAGNOSIS — F331 Major depressive disorder, recurrent, moderate: Secondary | ICD-10-CM | POA: Diagnosis not present

## 2022-04-24 DIAGNOSIS — F331 Major depressive disorder, recurrent, moderate: Secondary | ICD-10-CM | POA: Diagnosis not present

## 2022-04-26 ENCOUNTER — Encounter: Payer: Self-pay | Admitting: Family Medicine

## 2022-04-26 MED ORDER — FLUOXETINE HCL 40 MG PO CAPS: 40.0000 mg | ORAL_CAPSULE | Freq: Every day | ORAL | 3 refills | Status: DC

## 2022-04-28 DIAGNOSIS — F331 Major depressive disorder, recurrent, moderate: Secondary | ICD-10-CM | POA: Diagnosis not present

## 2022-05-01 DIAGNOSIS — F331 Major depressive disorder, recurrent, moderate: Secondary | ICD-10-CM | POA: Diagnosis not present

## 2022-05-29 DIAGNOSIS — F331 Major depressive disorder, recurrent, moderate: Secondary | ICD-10-CM | POA: Diagnosis not present

## 2022-06-05 DIAGNOSIS — F331 Major depressive disorder, recurrent, moderate: Secondary | ICD-10-CM | POA: Diagnosis not present

## 2022-06-06 DIAGNOSIS — F331 Major depressive disorder, recurrent, moderate: Secondary | ICD-10-CM | POA: Diagnosis not present

## 2022-06-12 DIAGNOSIS — F331 Major depressive disorder, recurrent, moderate: Secondary | ICD-10-CM | POA: Diagnosis not present

## 2022-06-19 DIAGNOSIS — F331 Major depressive disorder, recurrent, moderate: Secondary | ICD-10-CM | POA: Diagnosis not present

## 2022-06-21 DIAGNOSIS — F331 Major depressive disorder, recurrent, moderate: Secondary | ICD-10-CM | POA: Diagnosis not present

## 2022-06-27 DIAGNOSIS — F331 Major depressive disorder, recurrent, moderate: Secondary | ICD-10-CM | POA: Diagnosis not present

## 2022-07-11 DIAGNOSIS — F331 Major depressive disorder, recurrent, moderate: Secondary | ICD-10-CM | POA: Diagnosis not present

## 2022-07-17 DIAGNOSIS — F331 Major depressive disorder, recurrent, moderate: Secondary | ICD-10-CM | POA: Diagnosis not present

## 2022-07-18 DIAGNOSIS — F331 Major depressive disorder, recurrent, moderate: Secondary | ICD-10-CM | POA: Diagnosis not present

## 2022-07-24 DIAGNOSIS — F331 Major depressive disorder, recurrent, moderate: Secondary | ICD-10-CM | POA: Diagnosis not present

## 2022-08-14 DIAGNOSIS — F331 Major depressive disorder, recurrent, moderate: Secondary | ICD-10-CM | POA: Diagnosis not present

## 2022-08-15 DIAGNOSIS — F331 Major depressive disorder, recurrent, moderate: Secondary | ICD-10-CM | POA: Diagnosis not present

## 2022-08-24 DIAGNOSIS — F331 Major depressive disorder, recurrent, moderate: Secondary | ICD-10-CM | POA: Diagnosis not present

## 2022-09-06 ENCOUNTER — Other Ambulatory Visit (HOSPITAL_COMMUNITY): Payer: Self-pay

## 2022-09-06 MED ORDER — AZITHROMYCIN 250 MG PO TABS
ORAL_TABLET | ORAL | 0 refills | Status: DC
  Filled 2022-09-06: qty 6, 5d supply, fill #0

## 2022-09-06 MED ORDER — IBUPROFEN 800 MG PO TABS
800.0000 mg | ORAL_TABLET | Freq: Three times a day (TID) | ORAL | 0 refills | Status: DC | PRN
  Filled 2022-09-06: qty 24, 8d supply, fill #0

## 2022-10-23 DIAGNOSIS — F331 Major depressive disorder, recurrent, moderate: Secondary | ICD-10-CM | POA: Diagnosis not present

## 2022-10-30 DIAGNOSIS — F331 Major depressive disorder, recurrent, moderate: Secondary | ICD-10-CM | POA: Diagnosis not present

## 2022-11-06 DIAGNOSIS — F331 Major depressive disorder, recurrent, moderate: Secondary | ICD-10-CM | POA: Diagnosis not present

## 2022-11-08 ENCOUNTER — Encounter: Payer: Self-pay | Admitting: Family Medicine

## 2022-11-08 ENCOUNTER — Other Ambulatory Visit (HOSPITAL_COMMUNITY)
Admission: RE | Admit: 2022-11-08 | Discharge: 2022-11-08 | Disposition: A | Discharge status: Home / Self Care | Payer: BC Managed Care – PPO | Source: Ambulatory Visit | Attending: Family Medicine | Admitting: Family Medicine

## 2022-11-08 ENCOUNTER — Ambulatory Visit (INDEPENDENT_AMBULATORY_CARE_PROVIDER_SITE_OTHER): Payer: BC Managed Care – PPO | Admitting: Family Medicine

## 2022-11-08 VITALS — BP 106/78 | HR 80 | Temp 98.3°F | Resp 17 | Ht 62.0 in | Wt 130.4 lb

## 2022-11-08 DIAGNOSIS — Z124 Encounter for screening for malignant neoplasm of cervix: Secondary | ICD-10-CM

## 2022-11-08 NOTE — Progress Notes (Signed)
   Subjective:    Patient ID: Tara Reyes, female    DOB: [birth year], 32 y.o.   MRN: [id number]  HPI Pap- pt is ~1 yr overdue for pap.  IUD in place.  No vaginal concerns. No pain, d/c, concerns for STIs.    Review of Systems For ROS see HPI     Objective:   Physical Exam Vitals reviewed. Exam conducted with a chaperone present.  Constitutional:      General: She is not in acute distress.    Appearance: Normal appearance. She is not ill-appearing.  HENT:     Head: Normocephalic and atraumatic.  Genitourinary:    General: Normal vulva.     Pubic Area: No rash.      Labia:        Right: No rash, tenderness or lesion.        Left: No rash, tenderness or lesion.      Vagina: Foreign body (iud strings present) present. No vaginal discharge, erythema (of cervical os), tenderness or bleeding.     Cervix: Erythema (of os) present. No cervical motion tenderness, discharge or cervical bleeding.     Uterus: Normal. Not enlarged and not tender.      Adnexa:        Right: No mass, tenderness or fullness.         Left: No mass, tenderness or fullness.       Rectum: Normal.  Skin:    General: Skin is warm and dry.  Neurological:     Mental Status: She is alert.           Assessment & Plan:  Pap- collected b/c pt was overdue and wanted to be sure to get this done.  Exam WNL and IUD in place.

## 2022-11-08 NOTE — Patient Instructions (Signed)
Follow up as needed or as scheduled We'll notify you of your pap results and make any changes if needed Call with any questions or concerns Stay Safe!  Stay Healthy! Enjoy the rest of your summer!!!

## 2022-11-13 ENCOUNTER — Telehealth: Payer: Self-pay

## 2022-11-13 DIAGNOSIS — F331 Major depressive disorder, recurrent, moderate: Secondary | ICD-10-CM | POA: Diagnosis not present

## 2022-11-13 LAB — CYTOLOGY - PAP
Comment: NEGATIVE
Diagnosis: NEGATIVE
High risk HPV: NEGATIVE

## 2022-11-13 NOTE — Telephone Encounter (Signed)
-----   Message from Neena Rhymes sent at 11/13/2022  3:48 PM EDT ----- Normal pap- great news!

## 2022-11-13 NOTE — Telephone Encounter (Signed)
Left results on pt VM  

## 2022-11-15 DIAGNOSIS — F331 Major depressive disorder, recurrent, moderate: Secondary | ICD-10-CM | POA: Diagnosis not present

## 2022-11-20 DIAGNOSIS — F331 Major depressive disorder, recurrent, moderate: Secondary | ICD-10-CM | POA: Diagnosis not present

## 2022-12-04 DIAGNOSIS — F331 Major depressive disorder, recurrent, moderate: Secondary | ICD-10-CM | POA: Diagnosis not present

## 2022-12-08 DIAGNOSIS — F331 Major depressive disorder, recurrent, moderate: Secondary | ICD-10-CM | POA: Diagnosis not present

## 2022-12-11 DIAGNOSIS — F331 Major depressive disorder, recurrent, moderate: Secondary | ICD-10-CM | POA: Diagnosis not present

## 2022-12-18 DIAGNOSIS — F331 Major depressive disorder, recurrent, moderate: Secondary | ICD-10-CM | POA: Diagnosis not present

## 2022-12-22 DIAGNOSIS — F331 Major depressive disorder, recurrent, moderate: Secondary | ICD-10-CM | POA: Diagnosis not present

## 2022-12-25 DIAGNOSIS — F331 Major depressive disorder, recurrent, moderate: Secondary | ICD-10-CM | POA: Diagnosis not present

## 2023-01-01 DIAGNOSIS — F331 Major depressive disorder, recurrent, moderate: Secondary | ICD-10-CM | POA: Diagnosis not present

## 2023-01-03 DIAGNOSIS — F331 Major depressive disorder, recurrent, moderate: Secondary | ICD-10-CM | POA: Diagnosis not present

## 2023-01-15 DIAGNOSIS — F331 Major depressive disorder, recurrent, moderate: Secondary | ICD-10-CM | POA: Diagnosis not present

## 2023-01-17 ENCOUNTER — Encounter: Payer: Self-pay | Admitting: Advanced Practice Midwife

## 2023-01-17 ENCOUNTER — Ambulatory Visit: Payer: BC Managed Care – PPO | Admitting: Advanced Practice Midwife

## 2023-01-17 VITALS — BP 126/88 | HR 74 | Ht 62.0 in | Wt 133.0 lb

## 2023-01-17 DIAGNOSIS — N926 Irregular menstruation, unspecified: Secondary | ICD-10-CM

## 2023-01-17 DIAGNOSIS — Z3201 Encounter for pregnancy test, result positive: Secondary | ICD-10-CM

## 2023-01-17 LAB — POCT URINE PREGNANCY: Preg Test, Ur: POSITIVE — AB

## 2023-01-17 NOTE — Progress Notes (Signed)
GYN VISIT Patient name: Tara Reyes MRN [id number]  Date of birth: Mar 05, 1991 Chief Complaint:   +UPT  History of Present Illness:   Tara Reyes is a 32 y.o. G66P0000 Caucasian female being seen today for +UPT at home. IUD removed 11/19/22 with one cycle after.     Patient's last menstrual period was 12/16/2022. The current method of family planning is none.  Last pap August 2024. Results were: NILM w/ HRHPV negative     11/08/2022    9:15 AM 03/29/2022    8:20 AM 02/13/2022    7:43 AM 02/10/2021    7:43 AM 12/02/2019   12:38 PM  Depression screen PHQ 2/9  Decreased Interest 0 2 3 0 0  Down, Depressed, Hopeless 0 2 1 0 0  PHQ - 2 Score 0 4 4 0 0  Altered sleeping 0 1 1 0 0  Tired, decreased energy 0 1 1 0 0  Change in appetite 0 0 1 0 0  Feeling bad or failure about yourself  0 1 2 0 0  Trouble concentrating 0 2 2 0 0  Moving slowly or fidgety/restless 0 0 0 0 0  Suicidal thoughts 0 0 0 0 0  PHQ-9 Score 0 9 11 0 0  Difficult doing work/chores Not difficult at all Somewhat difficult Somewhat difficult Not difficult at all Not difficult at all        11/08/2022    9:16 AM  GAD 7 : Generalized Anxiety Score  Nervous, Anxious, on Edge 0  Control/stop worrying 0  Worry too much - different things 0  Trouble relaxing 0  Restless 0  Easily annoyed or irritable 0  Afraid - awful might happen 0  Total GAD 7 Score 0  Anxiety Difficulty Not difficult at all     Review of Systems:   Pertinent items are noted in HPI Denies fever/chills, dizziness, headaches, visual disturbances, fatigue, shortness of breath, chest pain, abdominal pain, vomiting, abnormal vaginal discharge/itching/odor/irritation, problems with periods, bowel movements, urination, or intercourse unless otherwise stated above.  Pertinent History Reviewed:  Reviewed past medical,surgical, social, obstetrical and family history.  Reviewed problem list, medications and allergies. Physical Assessment:    Vitals:   01/17/23 0950  BP: 126/88  Pulse: 74  Weight: 133 lb (60.3 kg)  Height: 5\' 2"  (1.575 m)  Body mass index is 24.33 kg/m.       Physical Examination:   General appearance: alert, well appearing, and in no distress  Mental status: alert, oriented to person, place, and time  Skin: warm & dry   Cardiovascular: normal heart rate noted  Respiratory: normal respiratory effort, no distress  Abdomen: soft, non-tender   Pelvic: examination not indicated  Extremities: no edema    Results for orders placed or performed in visit on 01/17/23 (from the past 24 hour(s))  POCT urine pregnancy   Collection Time: 01/17/23  9:57 AM  Result Value Ref Range   Preg Test, Ur Positive (A) Negative    Assessment & Plan:  1) Early preg > will get dating confirmation in ~4wks  2) Hypothyroid> continue Synthroid and will check labs @ [facility]  3) Depression> stable on Prozac 40mg ; will considering stopping/changing meds in third trimester  Meds: No orders of the defined types were placed in this encounter.   Orders Placed This Encounter  Procedures   POCT urine pregnancy    Return in about 4 weeks (around 02/14/2023) for Dating u/s 4wk.  Marcelle Smiling  Clelia Croft CNM 01/17/2023 1:17 PM

## 2023-01-22 DIAGNOSIS — F331 Major depressive disorder, recurrent, moderate: Secondary | ICD-10-CM | POA: Diagnosis not present

## 2023-01-25 IMAGING — MG DIGITAL DIAGNOSTIC BILAT W/ TOMO W/ CAD
8 series · 8 of 24 positions shown · non-contrast
Comparison: None.

CLINICAL DATA: Palpable abnormality in the LATERAL portion of the
LEFT breast which comes and goes over the last 4-5 months. Patient
is unable to feel the mass today.

EXAM:
DIGITAL DIAGNOSTIC BILATERAL MAMMOGRAM WITH TOMOSYNTHESIS AND CAD;
ULTRASOUND LEFT BREAST LIMITED
TECHNIQUE: Bilateral digital diagnostic mammography and breast tomosynthesis
was performed. The images were evaluated with computer-aided
detection.; Targeted ultrasound examination of the left breast was
performed.

[R CC synth-2D]
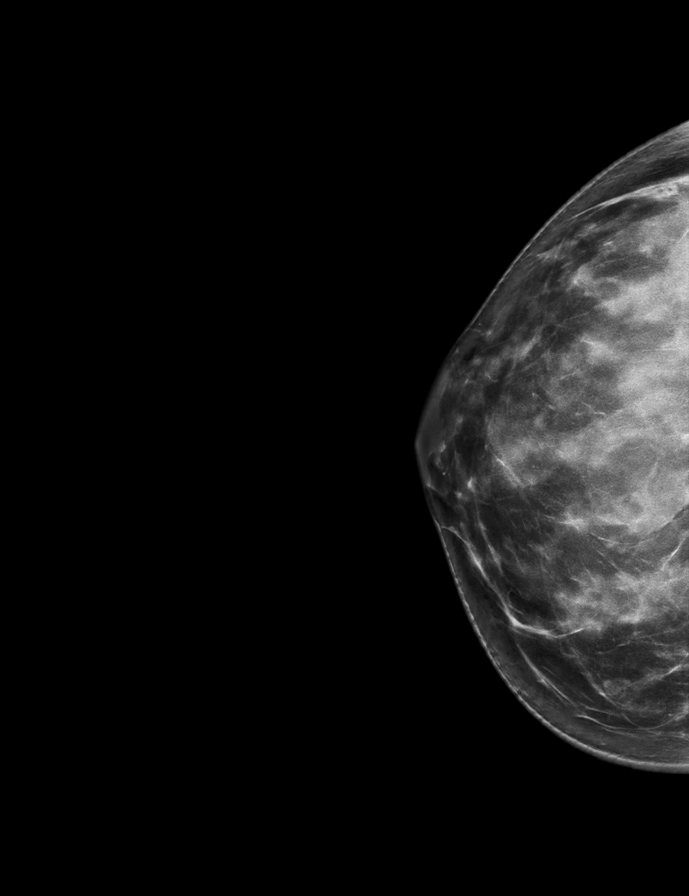

[L CC synth-2D]
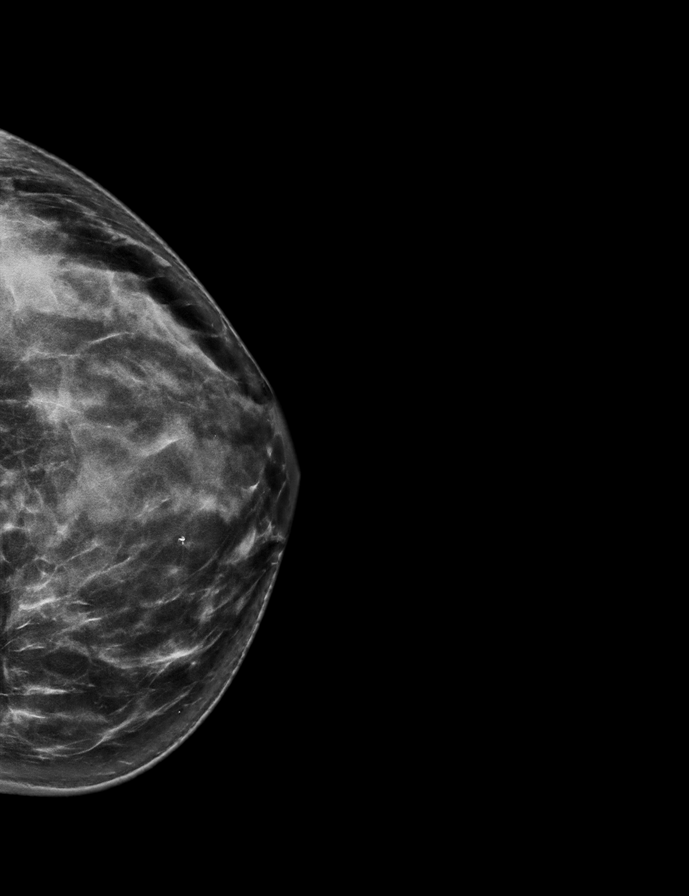

[R MLO synth-2D]
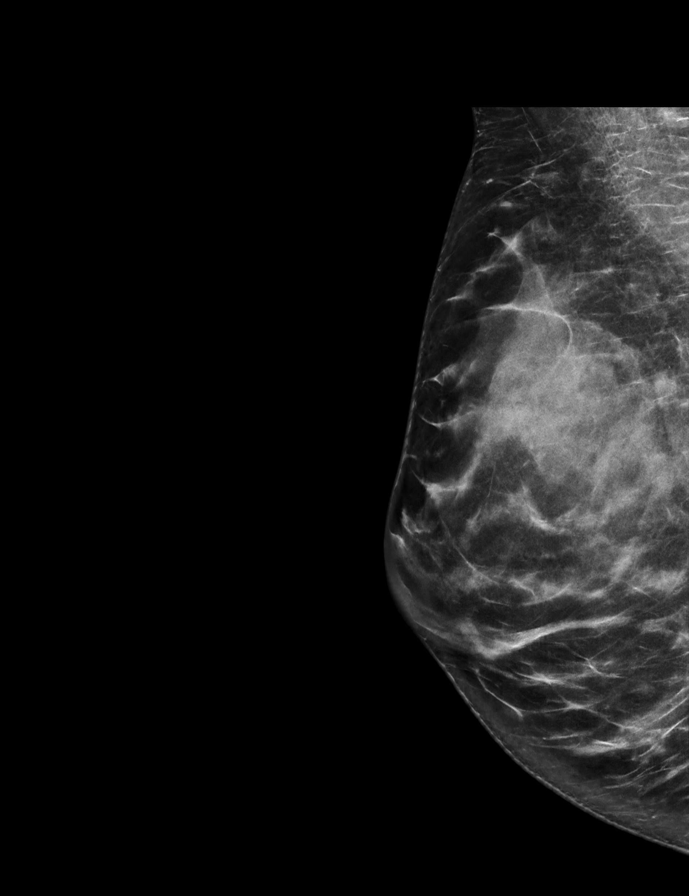

[L MLO synth-2D]
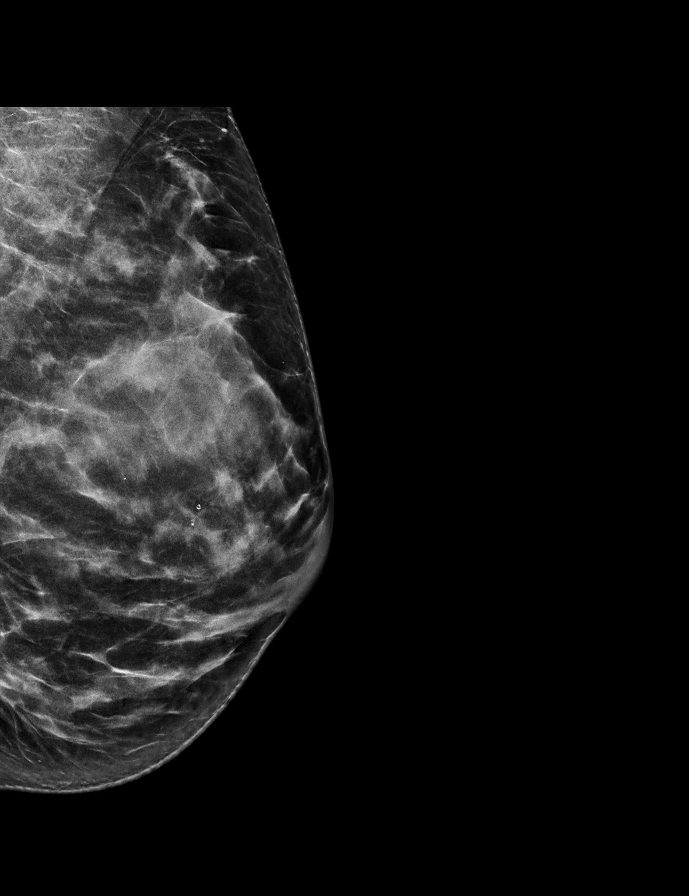

[L CC tomo · tomo slice 35/68.0]
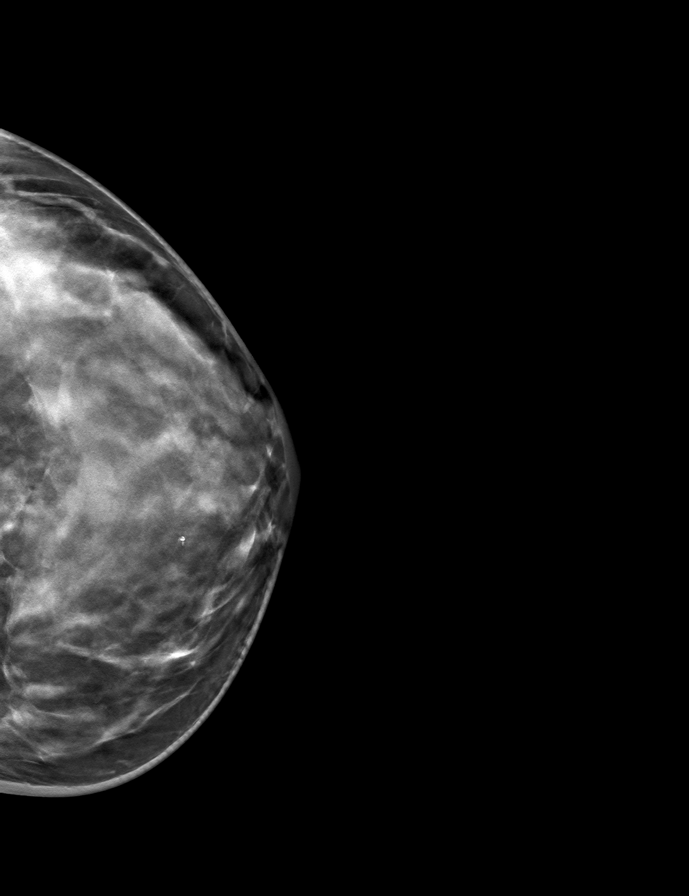

[R CC tomo · tomo slice 35/70.0]
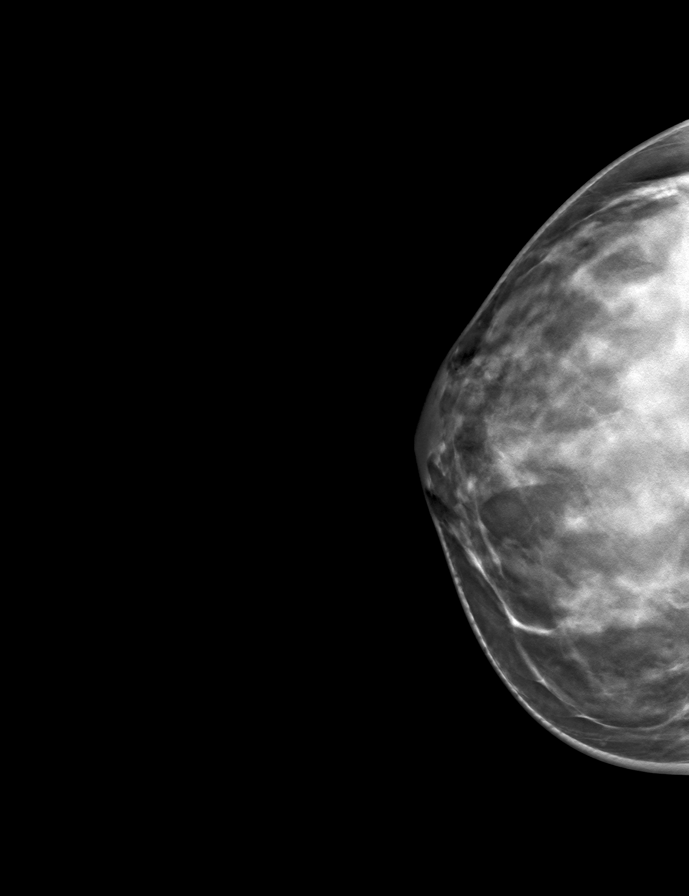

[R MLO tomo · tomo slice 34/67.0]
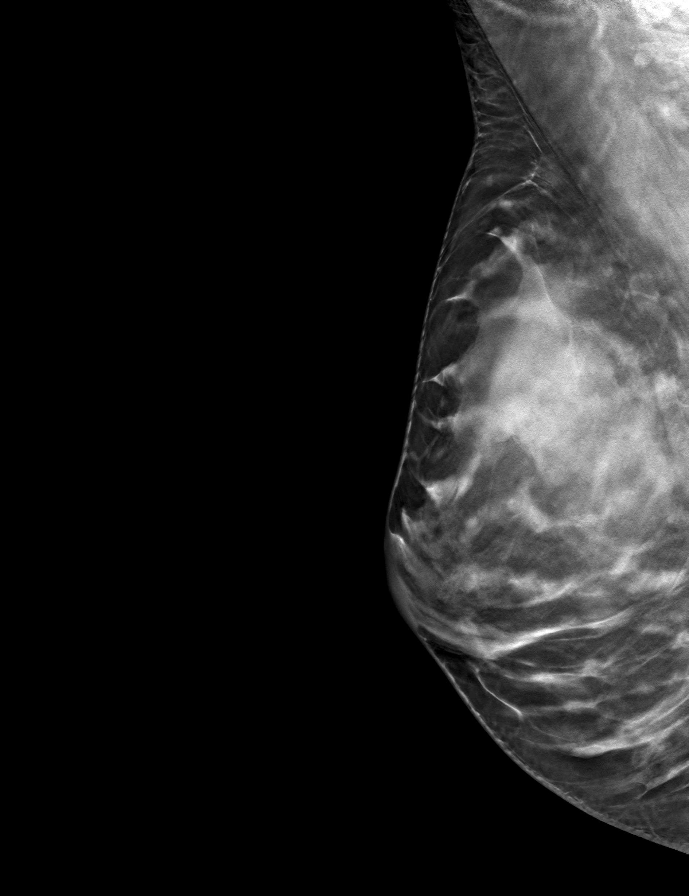

[L MLO tomo · tomo slice 32/63.0]
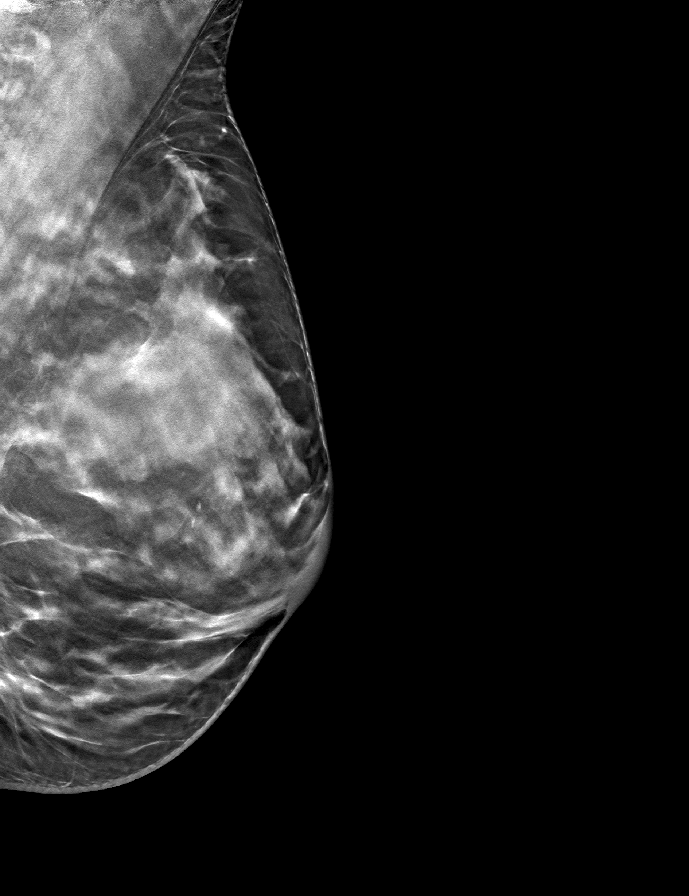

[8 of 24 positions shown; findings below may reference images not displayed]

ACR Breast Density Category d: The breast tissue is extremely dense,
which lowers the sensitivity of mammography.
FINDINGS: No suspicious mass, distortion, or microcalcifications are
identified to suggest presence of malignancy.

On physical exam, I palpate no discrete mass in the UPPER-OUTER
QUADRANT of the LEFT breast, in the area of patient's concern.

Targeted ultrasound is performed, showing normal appearing dense
fibroglandular tissue in the UPPER-OUTER QUADRANT of the LEFT
breast. No suspicious mass, distortion, or acoustic shadowing is
demonstrated with ultrasound.
IMPRESSION: No mammographic or ultrasound evidence for malignancy.

RECOMMENDATION:
Recommend screening mammogram at age 40 unless there are persistent
or intervening clinical concerns. (Code:[id number])

I have discussed the findings and recommendations with the patient.
If applicable, a reminder letter will be sent to the patient
regarding the next appointment.

BI-RADS CATEGORY  1: Negative.

## 2023-02-02 DIAGNOSIS — F331 Major depressive disorder, recurrent, moderate: Secondary | ICD-10-CM | POA: Diagnosis not present

## 2023-02-05 DIAGNOSIS — F331 Major depressive disorder, recurrent, moderate: Secondary | ICD-10-CM | POA: Diagnosis not present

## 2023-02-12 ENCOUNTER — Other Ambulatory Visit (HOSPITAL_COMMUNITY): Payer: Self-pay

## 2023-02-12 ENCOUNTER — Other Ambulatory Visit: Payer: Self-pay | Admitting: Obstetrics & Gynecology

## 2023-02-12 DIAGNOSIS — O3680X0 Pregnancy with inconclusive fetal viability, not applicable or unspecified: Secondary | ICD-10-CM

## 2023-02-12 DIAGNOSIS — F331 Major depressive disorder, recurrent, moderate: Secondary | ICD-10-CM | POA: Diagnosis not present

## 2023-02-14 ENCOUNTER — Encounter: Payer: Self-pay | Admitting: Radiology

## 2023-02-14 ENCOUNTER — Other Ambulatory Visit: Payer: BC Managed Care – PPO | Admitting: Radiology

## 2023-02-14 DIAGNOSIS — O3680X0 Pregnancy with inconclusive fetal viability, not applicable or unspecified: Secondary | ICD-10-CM

## 2023-02-14 DIAGNOSIS — Z3A08 8 weeks gestation of pregnancy: Secondary | ICD-10-CM | POA: Diagnosis not present

## 2023-02-14 NOTE — Progress Notes (Signed)
GA by known LMP = 8+4 weeks Anteverted uterus with single viable IUP   -  GS within upper mid fundus, sac intact CRL = 20 mm = 8+4 weeks    size = dates    FHR = 160 bpm      Yolk Sac = 5.1 mm     -    Posterior placenta Normal L ovary - Rt ovary not seen   -   neg adnexal regions Cervix long and closed   -   neg CDS   -  no free fluid

## 2023-02-15 DIAGNOSIS — F331 Major depressive disorder, recurrent, moderate: Secondary | ICD-10-CM | POA: Diagnosis not present

## 2023-02-16 ENCOUNTER — Encounter: Payer: BC Managed Care – PPO | Admitting: Family Medicine

## 2023-02-19 DIAGNOSIS — F331 Major depressive disorder, recurrent, moderate: Secondary | ICD-10-CM | POA: Diagnosis not present

## 2023-02-21 DIAGNOSIS — F331 Major depressive disorder, recurrent, moderate: Secondary | ICD-10-CM | POA: Diagnosis not present

## 2023-02-26 DIAGNOSIS — F331 Major depressive disorder, recurrent, moderate: Secondary | ICD-10-CM | POA: Diagnosis not present

## 2023-03-01 DIAGNOSIS — F331 Major depressive disorder, recurrent, moderate: Secondary | ICD-10-CM | POA: Diagnosis not present

## 2023-03-07 ENCOUNTER — Encounter: Payer: BC Managed Care – PPO | Admitting: Obstetrics and Gynecology

## 2023-03-12 ENCOUNTER — Encounter: Payer: Self-pay | Admitting: Women's Health

## 2023-03-12 ENCOUNTER — Other Ambulatory Visit: Payer: Self-pay | Admitting: Obstetrics & Gynecology

## 2023-03-12 DIAGNOSIS — Z3400 Encounter for supervision of normal first pregnancy, unspecified trimester: Secondary | ICD-10-CM | POA: Insufficient documentation

## 2023-03-12 DIAGNOSIS — Z3682 Encounter for antenatal screening for nuchal translucency: Secondary | ICD-10-CM

## 2023-03-13 ENCOUNTER — Ambulatory Visit (INDEPENDENT_AMBULATORY_CARE_PROVIDER_SITE_OTHER): Payer: BC Managed Care – PPO | Admitting: Women's Health

## 2023-03-13 ENCOUNTER — Ambulatory Visit (INDEPENDENT_AMBULATORY_CARE_PROVIDER_SITE_OTHER): Payer: BC Managed Care – PPO

## 2023-03-13 ENCOUNTER — Encounter: Payer: Self-pay | Admitting: Women's Health

## 2023-03-13 ENCOUNTER — Encounter: Payer: BC Managed Care – PPO | Admitting: *Deleted

## 2023-03-13 VITALS — BP 122/76 | HR 83 | Wt 138.0 lb

## 2023-03-13 DIAGNOSIS — Z3401 Encounter for supervision of normal first pregnancy, first trimester: Secondary | ICD-10-CM | POA: Diagnosis not present

## 2023-03-13 DIAGNOSIS — F339 Major depressive disorder, recurrent, unspecified: Secondary | ICD-10-CM

## 2023-03-13 DIAGNOSIS — Z3A12 12 weeks gestation of pregnancy: Secondary | ICD-10-CM

## 2023-03-13 DIAGNOSIS — Z3682 Encounter for antenatal screening for nuchal translucency: Secondary | ICD-10-CM | POA: Diagnosis not present

## 2023-03-13 DIAGNOSIS — Z131 Encounter for screening for diabetes mellitus: Secondary | ICD-10-CM

## 2023-03-13 DIAGNOSIS — E039 Hypothyroidism, unspecified: Secondary | ICD-10-CM | POA: Diagnosis not present

## 2023-03-13 DIAGNOSIS — Z3402 Encounter for supervision of normal first pregnancy, second trimester: Secondary | ICD-10-CM

## 2023-03-13 NOTE — Patient Instructions (Signed)
Tara Reyes, thank you for choosing our office today! We appreciate the opportunity to meet your healthcare needs. You may receive a short survey by mail, e-mail, or through [employer]. If you are happy with your care we would appreciate if you could take just a few minutes to complete the survey questions. We read all of your comments and take your feedback very seriously. Thank you again for choosing our office.  [facility] [employer] [facility] at [facility]  [facility] ([address] [place], [place] [address]) Entrance C, located off of [address] Free 24/7 valet parking   Nausea & Vomiting Have saltine crackers or pretzels by your bed and eat a few bites before you raise your head out of bed in the morning Eat small frequent meals throughout the day instead of large meals Drink plenty of fluids throughout the day to stay hydrated, just don't drink a lot of fluids with your meals.  This can make your stomach fill up faster making you feel sick Do not brush your teeth right after you eat Products with real ginger are good for nausea, like ginger ale and ginger hard candy Make sure it says made with real ginger! Sucking on sour candy like lemon heads is also good for nausea If your prenatal vitamins make you nauseated, take them at night so you will sleep through the nausea Sea Bands If you feel like you need medicine for the nausea & vomiting please let us know If you are unable to keep any fluids or food down please let us know   Constipation Drink plenty of fluid, preferably water, throughout the day Eat foods high in fiber such as fruits, vegetables, and grains Exercise, such as walking, is a good way to keep your bowels regular Drink warm fluids, especially warm prune juice, or decaf coffee Eat a 1/2 cup of real oatmeal (not instant), 1/2 cup applesauce, and 1/2-1 cup warm prune juice every day If needed, you may take Colace (docusate sodium) stool softener  once or twice a day to help keep the stool soft.  If you still are having problems with constipation, you may take Miralax once daily as needed to help keep your bowels regular.   Home Blood Pressure Monitoring for Patients   Your provider has recommended that you check your blood pressure (BP) at least once a week at home. If you do not have a blood pressure cuff at home, one will be provided for you. Contact your provider if you have not received your monitor within 1 week.   Helpful Tips for Accurate Home Blood Pressure Checks  Don't smoke, exercise, or drink caffeine 30 minutes before checking your BP Use the restroom before checking your BP (a full bladder can raise your pressure) Relax in a comfortable upright chair Feet on the ground Left arm resting comfortably on a flat surface at the level of your heart Legs uncrossed Back supported Sit quietly and don't talk Place the cuff on your bare arm Adjust snuggly, so that only two fingertips can fit between your skin and the top of the cuff Check 2 readings separated by at least one minute Keep a log of your BP readings For a visual, please reference this diagram: http://ccnc.care/bpdiagram  Provider Name: [facility]     Phone: [phone]  Zone 1: ALL CLEAR  Continue to monitor your symptoms:  BP reading is less than 140 (top number) or less than 90 (bottom  number)  No right upper stomach pain No headaches or seeing spots No feeling nauseated or throwing up No swelling in face and hands  Zone 2: CAUTION Call your doctor's office for any of the following:  BP reading is greater than 140 (top number) or greater than 90 (bottom number)  Stomach pain under your ribs in the middle or right side Headaches or seeing spots Feeling nauseated or throwing up Swelling in face and hands  Zone 3: EMERGENCY  Seek immediate medical care if you have any of the following:  BP reading is greater than160 (top number) or greater than  110 (bottom number) Severe headaches not improving with Tylenol Serious difficulty catching your breath Any worsening symptoms from Zone 2    First Trimester of Pregnancy The first trimester of pregnancy is from week 1 until the end of week 12 (months 1 through 3). A week after a sperm fertilizes an egg, the egg will implant on the wall of the uterus. This embryo will begin to develop into a baby. Genes from you and your partner are forming the baby. The female genes determine whether the baby is a boy or a girl. At 6-8 weeks, the eyes and face are formed, and the heartbeat can be seen on ultrasound. At the end of 12 weeks, all the baby's organs are formed.  Now that you are pregnant, you will want to do everything you can to have a healthy baby. Two of the most important things are to get good prenatal care and to follow your health care provider's instructions. Prenatal care is all the medical care you receive before the baby's birth. This care will help prevent, find, and treat any problems during the pregnancy and childbirth. BODY CHANGES Your body goes through many changes during pregnancy. The changes vary from woman to woman.  You may gain or lose a couple of pounds at first. You may feel sick to your stomach (nauseous) and throw up (vomit). If the vomiting is uncontrollable, call your health care provider. You may tire easily. You may develop headaches that can be relieved by medicines approved by your health care provider. You may urinate more often. Painful urination may mean you have a bladder infection. You may develop heartburn as a result of your pregnancy. You may develop constipation because certain hormones are causing the muscles that push waste through your intestines to slow down. You may develop hemorrhoids or swollen, bulging veins (varicose veins). Your breasts may begin to grow larger and become tender. Your nipples may stick out more, and the tissue that surrounds them  (areola) may become darker. Your gums may bleed and may be sensitive to brushing and flossing. Dark spots or blotches (chloasma, mask of pregnancy) may develop on your face. This will likely fade after the baby is born. Your menstrual periods will stop. You may have a loss of appetite. You may develop cravings for certain kinds of food. You may have changes in your emotions from day to day, such as being excited to be pregnant or being concerned that something may go wrong with the pregnancy and baby. You may have more vivid and strange dreams. You may have changes in your hair. These can include thickening of your hair, rapid growth, and changes in texture. Some women also have hair loss during or after pregnancy, or hair that feels dry or thin. Your hair will most likely return to normal after your baby is born. WHAT TO EXPECT AT YOUR PRENATAL  VISITS During a routine prenatal visit: You will be weighed to make sure you and the baby are growing normally. Your blood pressure will be taken. Your abdomen will be measured to track your baby's growth. The fetal heartbeat will be listened to starting around week 10 or 12 of your pregnancy. Test results from any previous visits will be discussed. Your health care provider may ask you: How you are feeling. If you are feeling the baby move. If you have had any abnormal symptoms, such as leaking fluid, bleeding, severe headaches, or abdominal cramping. If you have any questions. Other tests that may be performed during your first trimester include: Blood tests to find your blood type and to check for the presence of any previous infections. They will also be used to check for low iron levels (anemia) and Rh antibodies. Later in the pregnancy, blood tests for diabetes will be done along with other tests if problems develop. Urine tests to check for infections, diabetes, or protein in the urine. An ultrasound to confirm the proper growth and development  of the baby. An amniocentesis to check for possible genetic problems. Fetal screens for spina bifida and Down syndrome. You may need other tests to make sure you and the baby are doing well. HOME CARE INSTRUCTIONS  Medicines Follow your health care provider's instructions regarding medicine use. Specific medicines may be either safe or unsafe to take during pregnancy. Take your prenatal vitamins as directed. If you develop constipation, try taking a stool softener if your health care provider approves. Diet Eat regular, well-balanced meals. Choose a variety of foods, such as meat or vegetable-based protein, fish, milk and low-fat dairy products, vegetables, fruits, and whole grain breads and cereals. Your health care provider will help you determine the amount of weight gain that is right for you. Avoid raw meat and uncooked cheese. These carry germs that can cause birth defects in the baby. Eating four or five small meals rather than three large meals a day may help relieve nausea and vomiting. If you start to feel nauseous, eating a few soda crackers can be helpful. Drinking liquids between meals instead of during meals also seems to help nausea and vomiting. If you develop constipation, eat more high-fiber foods, such as fresh vegetables or fruit and whole grains. Drink enough fluids to keep your urine clear or pale yellow. Activity and Exercise Exercise only as directed by your health care provider. Exercising will help you: Control your weight. Stay in shape. Be prepared for labor and delivery. Experiencing pain or cramping in the lower abdomen or low back is a good sign that you should stop exercising. Check with your health care provider before continuing normal exercises. Try to avoid standing for long periods of time. Move your legs often if you must stand in one place for a long time. Avoid heavy lifting. Wear low-heeled shoes, and practice good posture. You may continue to have sex  unless your health care provider directs you otherwise. Relief of Pain or Discomfort Wear a good support bra for breast tenderness.   Take warm sitz baths to soothe any pain or discomfort caused by hemorrhoids. Use hemorrhoid cream if your health care provider approves.   Rest with your legs elevated if you have leg cramps or low back pain. If you develop varicose veins in your legs, wear support hose. Elevate your feet for 15 minutes, 3-4 times a day. Limit salt in your diet. Prenatal Care Schedule your prenatal visits by the  twelfth week of pregnancy. They are usually scheduled monthly at first, then more often in the last 2 months before delivery. Write down your questions. Take them to your prenatal visits. Keep all your prenatal visits as directed by your health care provider. Safety Wear your seat belt at all times when driving. Make a list of emergency phone numbers, including numbers for family, friends, the hospital, and police and fire departments. General Tips Ask your health care provider for a referral to a local prenatal education class. Begin classes no later than at the beginning of month 6 of your pregnancy. Ask for help if you have counseling or nutritional needs during pregnancy. Your health care provider can offer advice or refer you to specialists for help with various needs. Do not use hot tubs, steam rooms, or saunas. Do not douche or use tampons or scented sanitary pads. Do not cross your legs for long periods of time. Avoid cat litter boxes and soil used by cats. These carry germs that can cause birth defects in the baby and possibly loss of the fetus by miscarriage or stillbirth. Avoid all smoking, herbs, alcohol, and medicines not prescribed by your health care provider. Chemicals in these affect the formation and growth of the baby. Schedule a dentist appointment. At home, brush your teeth with a soft toothbrush and be gentle when you floss. SEEK MEDICAL CARE IF:   You have dizziness. You have mild pelvic cramps, pelvic pressure, or nagging pain in the abdominal area. You have persistent nausea, vomiting, or diarrhea. You have a bad smelling vaginal discharge. You have pain with urination. You notice increased swelling in your face, hands, legs, or ankles. SEEK IMMEDIATE MEDICAL CARE IF:  You have a fever. You are leaking fluid from your vagina. You have spotting or bleeding from your vagina. You have severe abdominal cramping or pain. You have rapid weight gain or loss. You vomit blood or material that looks like coffee grounds. You are exposed to [place] measles and have never had them. You are exposed to fifth disease or chickenpox. You develop a severe headache. You have shortness of breath. You have any kind of trauma, such as from a fall or a car accident. Document Released: 03/07/2001 Document Revised: 07/28/2013 Document Reviewed: 01/21/2013 [facility] Patient Information 2015 [place], [place]. This information is not intended to replace advice given to you by your health care provider. Make sure you discuss any questions you have with your health care provider.

## 2023-03-13 NOTE — Addendum Note (Signed)
Addended by: Moss Mc on: 03/13/2023 04:06 PM   Modules accepted: Orders

## 2023-03-13 NOTE — Progress Notes (Signed)
INITIAL OBSTETRICAL VISIT Patient name: Tara Reyes MRN [id number]  Date of birth: 1990/11/14 Chief Complaint:   Initial Prenatal Visit  History of Present Illness:   Tara Reyes is a 32 y.o. G35P0000 Caucasian female at [redacted]w[redacted]d by LMP c/w u/s at 8 weeks with an Estimated Date of Delivery: 09/22/23 being seen today for her initial obstetrical visit.   Patient's last menstrual period was 12/16/2022. Her obstetrical history is significant for primigravida.   Today she reports  some n/v- improving, takes unisom/vitb6 .  Dep- doing well w/ prozac, has therapist Hypothyroidism- on synthroid Last pap 11/08/22. Results were: NILM w/ HRHPV negative     03/13/2023    1:56 PM 11/08/2022    9:15 AM 03/29/2022    8:20 AM 02/13/2022    7:43 AM 02/10/2021    7:43 AM  Depression screen PHQ 2/9  Decreased Interest 0 0 2 3 0  Down, Depressed, Hopeless 0 0 2 1 0  PHQ - 2 Score 0 0 4 4 0  Altered sleeping 0 0 1 1 0  Tired, decreased energy 0 0 1 1 0  Change in appetite 0 0 0 1 0  Feeling bad or failure about yourself  0 0 1 2 0  Trouble concentrating 0 0 2 2 0  Moving slowly or fidgety/restless 0 0 0 0 0  Suicidal thoughts 0 0 0 0 0  PHQ-9 Score 0 0 9 11 0  Difficult doing work/chores  Not difficult at all Somewhat difficult Somewhat difficult Not difficult at all        03/13/2023    1:56 PM 11/08/2022    9:16 AM  GAD 7 : Generalized Anxiety Score  Nervous, Anxious, on Edge 0 0  Control/stop worrying 0 0  Worry too much - different things 0 0  Trouble relaxing 0 0  Restless 0 0  Easily annoyed or irritable 0 0  Afraid - awful might happen 0 0  Total GAD 7 Score 0 0  Anxiety Difficulty  Not difficult at all     Review of Systems:   Pertinent items are noted in HPI Denies cramping/contractions, leakage of fluid, vaginal bleeding, abnormal vaginal discharge w/ itching/odor/irritation, headaches, visual changes, shortness of breath, chest pain, abdominal pain, severe  nausea/vomiting, or problems with urination or bowel movements unless otherwise stated above.  Pertinent History Reviewed:  Reviewed past medical,surgical, social, obstetrical and family history.  Reviewed problem list, medications and allergies. OB History  Gravida Para Term Preterm AB Living  1 0 0 0 0   SAB IAB Ectopic Multiple Live Births  0 0 0      # Outcome Date GA Lbr Len/2nd Weight Sex Type Anes PTL Lv  1 Current            Physical Assessment:   Vitals:   03/13/23 1354  BP: 122/76  Pulse: 83  Weight: 138 lb (62.6 kg)  Body mass index is 25.24 kg/m.       Physical Examination:  General appearance - well appearing, and in no distress  Mental status - alert, oriented to person, place, and time  Psych:  She has a normal mood and affect  Skin - warm and dry, normal color, no suspicious lesions noted  Chest - effort normal, all lung fields clear to auscultation bilaterally  Heart - normal rate and regular rhythm  Abdomen - soft, nontender  Extremities:  No swelling or varicosities noted  Thin prep pap is  not done   Chaperone: N/A    TODAY'S NT [place] 12+3 wks,measurements c/w dates,posterior placenta,CRL 63.76 mm,NB present,NT 1.6 mm,normal ovaries,FHR 157 bpm   No results found for this or any previous visit (from the past 24 hours).  Assessment & Plan:  1) Low-Risk Pregnancy G1P0000 at [redacted]w[redacted]d with an Estimated Date of Delivery: 09/22/23   2) Initial OB visit  3) Depression> doing well on prozac, has therapist  4) Hypothyroidism> on synthroid , TSH today  Meds: No orders of the defined types were placed in this encounter.   Initial labs obtained Continue prenatal vitamins Reviewed n/v relief measures and warning s/s to report Reviewed recommended weight gain based on pre-gravid BMI Encouraged well-balanced diet Genetic & carrier screening discussed: requests NT/IT and Horizon , declines Pan[facility] Ultrasound discussed; fetal survey: requested [facility]  completed> form faxed if has or is planning to apply for medicaid The nature of [place] [facility] [employer] with multiple MDs and other Advanced Practice Providers was explained to patient; also emphasized that fellows, residents, and students are part of our team. Does have home bp cuff. Office bp cuff given: no. Rx sent: n/a. Check bp weekly, let us know if consistently >140/90.   Indications for ASA therapy (per uptodate) OR Two or more of the following: Nulliparity Yes Obesity (BMI>30 kg/m2) No Family h/o preeclampsia in mother or sister No Age >=35 years No Sociodemographic characteristics (African American race, low socioeconomic level) No Personal risk factors (eg, previous pregnancy w/ LBW or SGA, previous adverse pregnancy outcome [eg, stillbirth], interval >10 years between pregnancies) No  Follow-up: Return in about 4 weeks (around 04/10/2023) for LROB, 2nd IT, CNM, in person; then 7wks from now anatomy u/s and LROB w/ CNM.   Orders Placed This Encounter  Procedures   Urine Culture   GC/Chlamydia Probe Amp   Integrated 1   Hemoglobin A1c   CBC/D/Plt+RPR+Rh+ABO+RubIgG...   TSH    Cheral Marker CNM, [facility] 03/13/2023 2:51 PM

## 2023-03-13 NOTE — Progress Notes (Signed)
[place] 12+3 wks,measurements c/w dates,posterior placenta,CRL 63.76 mm,NB present,NT 1.6 mm,normal ovaries,FHR 157 bpm

## 2023-03-14 LAB — INTEGRATED 1

## 2023-03-15 LAB — HEMOGLOBIN A1C
Est. average glucose Bld gHb Est-mCnc: 111 mg/dL
Hgb A1c MFr Bld: 5.5 % (ref 4.8–5.6)

## 2023-03-15 LAB — CBC/D/PLT+RPR+RH+ABO+RUBIGG...
Antibody Screen: NEGATIVE
Basophils Absolute: 0 10*3/uL (ref 0.0–0.2)
Basos: 0 %
EOS (ABSOLUTE): 0.1 10*3/uL (ref 0.0–0.4)
Eos: 1 %
HCV Ab: NONREACTIVE
Hematocrit: 41.7 % (ref 34.0–46.6)
Hemoglobin: 13.8 g/dL (ref 11.1–15.9)
Hepatitis B Surface Ag: NEGATIVE
Immature Grans (Abs): 0 10*3/uL (ref 0.0–0.1)
Immature Granulocytes: 0 %
Lymphocytes Absolute: 1.8 10*3/uL (ref 0.7–3.1)
Lymphs: 26 %
MCH: 31.2 pg (ref 26.6–33.0)
MCHC: 33.1 g/dL (ref 31.5–35.7)
MCV: 94 fL (ref 79–97)
Monocytes Absolute: 0.6 10*3/uL (ref 0.1–0.9)
Monocytes: 8 %
Neutrophils Absolute: 4.3 10*3/uL (ref 1.4–7.0)
Neutrophils: 65 %
Platelets: 237 10*3/uL (ref 150–450)
RBC: 4.43 x10E6/uL (ref 3.77–5.28)
RDW: 12 % (ref 11.7–15.4)
RPR Ser Ql: NONREACTIVE
Rh Factor: POSITIVE
Rubella Antibodies, IGG: 1.54 {index} (ref >0.99)
WBC: 6.8 10*3/uL (ref 3.4–10.8)

## 2023-03-15 LAB — HCV INTERPRETATION

## 2023-03-15 LAB — INTEGRATED 1
Crown Rump Length: 63.8 mm
Gest. Age on Collection Date: 12.6 wk
PAPP-A Value: 1923.1 ng/mL
Race: 1
Sonographer ID#: 309760
Sonographer ID#: 33 a
Weight: 1.6 mm
Weight: 138 [lb_av]

## 2023-03-15 LAB — URINE CULTURE

## 2023-03-15 LAB — TSH: TSH: 3.18 u[IU]/mL (ref 0.450–4.500)

## 2023-03-16 LAB — GC/CHLAMYDIA PROBE AMP
Chlamydia trachomatis, NAA: NEGATIVE
Neisseria Gonorrhoeae by PCR: NEGATIVE

## 2023-03-17 ENCOUNTER — Other Ambulatory Visit: Payer: Self-pay | Admitting: Advanced Practice Midwife

## 2023-03-17 MED ORDER — ONDANSETRON 4 MG PO TBDP: 4.0000 mg | ORAL_TABLET | Freq: Three times a day (TID) | ORAL | 1 refills | Status: DC | PRN

## 2023-03-17 MED ORDER — PROMETHAZINE HCL 25 MG PO TABS: 25.0000 mg | ORAL_TABLET | Freq: Four times a day (QID) | ORAL | 1 refills | Status: DC | PRN

## 2023-03-27 LAB — HORIZON CUSTOM: REPORT SUMMARY: NEGATIVE

## 2023-04-02 DIAGNOSIS — L578 Other skin changes due to chronic exposure to nonionizing radiation: Secondary | ICD-10-CM | POA: Diagnosis not present

## 2023-04-02 DIAGNOSIS — D225 Melanocytic nevi of trunk: Secondary | ICD-10-CM | POA: Diagnosis not present

## 2023-04-11 ENCOUNTER — Ambulatory Visit (INDEPENDENT_AMBULATORY_CARE_PROVIDER_SITE_OTHER): Payer: BC Managed Care – PPO | Admitting: Women's Health

## 2023-04-11 ENCOUNTER — Encounter: Payer: Self-pay | Admitting: Women's Health

## 2023-04-11 VITALS — BP 106/70 | HR 94 | Wt 143.3 lb

## 2023-04-11 DIAGNOSIS — Z1379 Encounter for other screening for genetic and chromosomal anomalies: Secondary | ICD-10-CM

## 2023-04-11 DIAGNOSIS — Z3A16 16 weeks gestation of pregnancy: Secondary | ICD-10-CM

## 2023-04-11 DIAGNOSIS — Z3402 Encounter for supervision of normal first pregnancy, second trimester: Secondary | ICD-10-CM

## 2023-04-11 DIAGNOSIS — F331 Major depressive disorder, recurrent, moderate: Secondary | ICD-10-CM | POA: Diagnosis not present

## 2023-04-11 DIAGNOSIS — Z363 Encounter for antenatal screening for malformations: Secondary | ICD-10-CM

## 2023-04-11 NOTE — Progress Notes (Signed)
 LOW-RISK PREGNANCY VISIT Patient name: Tara Reyes MRN [id number]  Date of birth: 06-May-1990 Chief Complaint:   Routine Prenatal Visit (2 nd IT)  History of Present Illness:   Tara Reyes is a 33 y.o. G1P0000 female at [redacted]w[redacted]d with an Estimated Date of Delivery: 09/22/23 being seen today for ongoing management of a low-risk pregnancy.   Today she reports no complaints. Contractions: Not present.  .  Movement: Absent. denies leaking of fluid.     03/13/2023    1:56 PM 11/08/2022    9:15 AM 03/29/2022    8:20 AM 02/13/2022    7:43 AM 02/10/2021    7:43 AM  Depression screen PHQ 2/9  Decreased Interest 0 0 2 3 0  Down, Depressed, Hopeless 0 0 2 1 0  PHQ - 2 Score 0 0 4 4 0  Altered sleeping 0 0 1 1 0  Tired, decreased energy 0 0 1 1 0  Change in appetite 0 0 0 1 0  Feeling bad or failure about yourself  0 0 1 2 0  Trouble concentrating 0 0 2 2 0  Moving slowly or fidgety/restless 0 0 0 0 0  Suicidal thoughts 0 0 0 0 0  PHQ-9 Score 0 0 9 11 0  Difficult doing work/chores  Not difficult at all Somewhat difficult Somewhat difficult Not difficult at all        03/13/2023    1:56 PM 11/08/2022    9:16 AM  GAD 7 : Generalized Anxiety Score  Nervous, Anxious, on Edge 0 0  Control/stop worrying 0 0  Worry too much - different things 0 0  Trouble relaxing 0 0  Restless 0 0  Easily annoyed or irritable 0 0  Afraid - awful might happen 0 0  Total GAD 7 Score 0 0  Anxiety Difficulty  Not difficult at all      Review of Systems:   Pertinent items are noted in HPI Denies abnormal vaginal discharge w/ itching/odor/irritation, headaches, visual changes, shortness of breath, chest pain, abdominal pain, severe nausea/vomiting, or problems with urination or bowel movements unless otherwise stated above. Pertinent History Reviewed:  Reviewed past medical,surgical, social, obstetrical and family history.  Reviewed problem list, medications and allergies. Physical Assessment:    Vitals:   04/11/23 1337  BP: 106/70  Pulse: 94  Weight: 143 lb 4.8 oz (65 kg)  Body mass index is 26.21 kg/m.        Physical Examination:   General appearance: Well appearing, and in no distress  Mental status: Alert, oriented to person, place, and time  Skin: Warm & dry  Cardiovascular: Normal heart rate noted  Respiratory: Normal respiratory effort, no distress  Abdomen: Soft, gravid, nontender  Pelvic: Cervical exam deferred         Extremities: Edema: None  Fetal Status: Fetal Heart Rate (bpm): 148   Movement: Absent    Chaperone: N/A   No results found for this or any previous visit (from the past 24 hours).  Assessment & Plan:  1) Low-risk pregnancy G1P0000 at [redacted]w[redacted]d with an Estimated Date of Delivery: 09/22/23    Meds: No orders of the defined types were placed in this encounter.  Labs/procedures today: 2nd IT  Plan:  Continue routine obstetrical care  Next visit: prefers will be in person for u/s     Reviewed: Preterm labor symptoms and general obstetric precautions including but not limited to vaginal bleeding, contractions, leaking of fluid and fetal movement  were reviewed in detail with the patient.  All questions were answered. Does have home bp cuff. Office bp cuff given: not applicable. Check bp weekly, let us  know if consistently >140 and/or >90.  Follow-up: Return for As scheduled.  Future Appointments  Date Time Provider Department Center  05/02/2023  8:30 AM [facility]G 2 [facility] None  05/02/2023  9:30 AM Ferd Householder, CNM [facility]    Orders Placed This Encounter  Procedures   US  OB Comp + 14 Wk   INTEGRATED 2   Ferd Householder CNM, [facility] 04/11/2023 1:54 PM

## 2023-04-11 NOTE — Patient Instructions (Signed)
 Tara Reyes, thank you for choosing our office today! We appreciate the opportunity to meet your healthcare needs. You may receive a short survey by mail, e-mail, or through [employer]. If you are happy with your care we would appreciate if you could take just a few minutes to complete the survey questions. We read all of your comments and take your feedback very seriously. Thank you again for choosing our office.  Center for [employer] Team at [facility] at [facility] ([address] [place], [place] [address]) Entrance C, located off of [address] Free 24/7 valet parking  Go to [email or web address] to register for FREE online childbirth classes  Call the office [phone]) or go to [facility] if: You begin to severe cramping Your water breaks.  Sometimes it is a big gush of fluid, sometimes it is just a trickle that keeps getting your panties wet or running down your legs You have vaginal bleeding.  It is normal to have a small amount of spotting if your cervix was checked.   [facility] Pediatricians/Family Doctors [facility]): [address] Meg Spina, [phone]           [facility]: [address], [phone]                [facility]): [address], [phone] (call to ask if accepting patients) [facility] Department: [address], [place], [phone]    [facility] Pediatricians/Family Doctors [facility]): [phone] [address], [phone] [facility]: [address] [place], [phone] [facility]: [address], [phone]  [facility] Doctors  [facility]): [phone] [facility]: [address] [phone]   [facility] Doctors [facility]: [address], [phone]  [facility] Doctors  [facility]: [phone], [phone]  Home Blood Pressure Monitoring for Patients   Your provider has recommended that you check your blood pressure (BP) at least once a week at home. If you do not have a blood pressure cuff at home, one will be provided for you. Contact your provider if you have not received your monitor within 1 week.   Helpful Tips for Accurate Home Blood Pressure Checks  Don't smoke, exercise, or drink caffeine 30 minutes before checking your BP Use the restroom before checking your BP (a full bladder can raise your pressure) Relax in a comfortable upright chair Feet on the ground Left arm resting comfortably on a flat surface at the level of your heart Legs uncrossed Back supported Sit quietly and don't talk Place the cuff on your bare arm Adjust snuggly, so that only two fingertips can fit between your skin and the top of the cuff Check 2 readings separated by at least one minute Keep a log of your BP readings For a visual, please reference this diagram: http://ccnc.care/bpdiagram  Provider Name: [facility]     Phone: [phone]  Zone 1: ALL CLEAR  Continue to monitor your symptoms:  BP reading is less than 140 (top number) or less than 90 (bottom number)  No right upper stomach pain No headaches or seeing spots No feeling nauseated or throwing up No swelling in face and hands  Zone 2: CAUTION Call your doctor's office for any of the following:  BP reading is greater than 140 (top number) or greater than  90 (bottom number)  Stomach pain under your ribs in the middle or right side Headaches or seeing spots Feeling nauseated or throwing up Swelling in face and hands  Zone 3: EMERGENCY  Seek immediate medical care if you have any of the following:  BP reading is greater than160 (top number) or greater than 110 (bottom number) Severe headaches not improving with Tylenol Serious difficulty catching your breath Any worsening symptoms from  Zone 2     Second Trimester of Pregnancy The second trimester is from week 14 through week 27 (months 4 through 6). The second trimester is often a time when you feel your best. Your body has adjusted to being pregnant, and you begin to feel better physically. Usually, morning sickness has lessened or quit completely, you may have more energy, and you may have an increase in appetite. The second trimester is also a time when the fetus is growing rapidly. At the end of the sixth month, the fetus is about 9 inches long and weighs about 1 pounds. You will likely begin to feel the baby move (quickening) between 16 and 20 weeks of pregnancy. Body changes during your second trimester Your body continues to go through many changes during your second trimester. The changes vary from woman to woman. Your weight will continue to increase. You will notice your lower abdomen bulging out. You may begin to get stretch marks on your hips, abdomen, and breasts. You may develop headaches that can be relieved by medicines. The medicines should be approved by your health care provider. You may urinate more often because the fetus is pressing on your bladder. You may develop or continue to have heartburn as a result of your pregnancy. You may develop constipation because certain hormones are causing the muscles that push waste through your intestines to slow down. You may develop hemorrhoids or swollen, bulging veins (varicose veins). You may have back pain. This is caused by: Weight gain. Pregnancy hormones that are relaxing the joints in your pelvis. A shift in weight and the muscles that support your balance. Your breasts will continue to grow and they will continue to become tender. Your gums may bleed and may be sensitive to brushing and flossing. Dark spots or blotches (chloasma, mask of pregnancy) may develop on your face. This will likely fade after the baby is born. A dark line from your belly button to  the pubic area (linea nigra) may appear. This will likely fade after the baby is born. You may have changes in your hair. These can include thickening of your hair, rapid growth, and changes in texture. Some women also have hair loss during or after pregnancy, or hair that feels dry or thin. Your hair will most likely return to normal after your baby is born.  What to expect at prenatal visits During a routine prenatal visit: You will be weighed to make sure you and the fetus are growing normally. Your blood pressure will be taken. Your abdomen will be measured to track your baby's growth. The fetal heartbeat will be listened to. Any test results from the previous visit will be discussed.  Your health care provider may ask you: How you are feeling. If you are feeling the baby move. If you have had any abnormal symptoms, such as leaking fluid, bleeding, severe headaches, or abdominal cramping. If you are using any tobacco products, including cigarettes, chewing tobacco, and electronic cigarettes. If you have any questions.  Other tests that may be performed during  your second trimester include: Blood tests that check for: Low iron levels (anemia). High blood sugar that affects pregnant women (gestational diabetes) between 39 and 28 weeks. Rh antibodies. This is to check for a protein on red blood cells (Rh factor). Urine tests to check for infections, diabetes, or protein in the urine. An ultrasound to confirm the proper growth and development of the baby. An amniocentesis to check for possible genetic problems. Fetal screens for spina bifida and Down syndrome. HIV (human immunodeficiency virus) testing. Routine prenatal testing includes screening for HIV, unless you choose not to have this test.  Follow these instructions at home: Medicines Follow your health care provider's instructions regarding medicine use. Specific medicines may be either safe or unsafe to take during  pregnancy. Take a prenatal vitamin that contains at least 600 micrograms (mcg) of folic acid. If you develop constipation, try taking a stool softener if your health care provider approves. Eating and drinking Eat a balanced diet that includes fresh fruits and vegetables, whole grains, good sources of protein such as meat, eggs, or tofu, and low-fat dairy. Your health care provider will help you determine the amount of weight gain that is right for you. Avoid raw meat and uncooked cheese. These carry germs that can cause birth defects in the baby. If you have low calcium intake from food, talk to your health care provider about whether you should take a daily calcium supplement. Limit foods that are high in fat and processed sugars, such as fried and sweet foods. To prevent constipation: Drink enough fluid to keep your urine clear or pale yellow. Eat foods that are high in fiber, such as fresh fruits and vegetables, whole grains, and beans. Activity Exercise only as directed by your health care provider. Most women can continue their usual exercise routine during pregnancy. Try to exercise for 30 minutes at least 5 days a week. Stop exercising if you experience uterine contractions. Avoid heavy lifting, wear low heel shoes, and practice good posture. A sexual relationship may be continued unless your health care provider directs you otherwise. Relieving pain and discomfort Wear a good support bra to prevent discomfort from breast tenderness. Take warm sitz baths to soothe any pain or discomfort caused by hemorrhoids. Use hemorrhoid cream if your health care provider approves. Rest with your legs elevated if you have leg cramps or low back pain. If you develop varicose veins, wear support hose. Elevate your feet for 15 minutes, 3-4 times a day. Limit salt in your diet. Prenatal Care Write down your questions. Take them to your prenatal visits. Keep all your prenatal visits as told by your health  care provider. This is important. Safety Wear your seat belt at all times when driving. Make a list of emergency phone numbers, including numbers for family, friends, the hospital, and police and fire departments. General instructions Ask your health care provider for a referral to a local prenatal education class. Begin classes no later than the beginning of month 6 of your pregnancy. Ask for help if you have counseling or nutritional needs during pregnancy. Your health care provider can offer advice or refer you to specialists for help with various needs. Do not use hot tubs, steam rooms, or saunas. Do not douche or use tampons or scented sanitary pads. Do not cross your legs for long periods of time. Avoid cat litter boxes and soil used by cats. These carry germs that can cause birth defects in the baby and possibly loss of the  fetus by miscarriage or stillbirth. Avoid all smoking, herbs, alcohol, and unprescribed drugs. Chemicals in these products can affect the formation and growth of the baby. Do not use any products that contain nicotine or tobacco, such as cigarettes and e-cigarettes. If you need help quitting, ask your health care provider. Visit your dentist if you have not gone yet during your pregnancy. Use a soft toothbrush to brush your teeth and be gentle when you floss. Contact a health care provider if: You have dizziness. You have mild pelvic cramps, pelvic pressure, or nagging pain in the abdominal area. You have persistent nausea, vomiting, or diarrhea. You have a bad smelling vaginal discharge. You have pain when you urinate. Get help right away if: You have a fever. You are leaking fluid from your vagina. You have spotting or bleeding from your vagina. You have severe abdominal cramping or pain. You have rapid weight gain or weight loss. You have shortness of breath with chest pain. You notice sudden or extreme swelling of your face, hands, ankles, feet, or legs. You  have not felt your baby move in over an hour. You have severe headaches that do not go away when you take medicine. You have vision changes. Summary The second trimester is from week 14 through week 27 (months 4 through 6). It is also a time when the fetus is growing rapidly. Your body goes through many changes during pregnancy. The changes vary from woman to woman. Avoid all smoking, herbs, alcohol, and unprescribed drugs. These chemicals affect the formation and growth your baby. Do not use any tobacco products, such as cigarettes, chewing tobacco, and e-cigarettes. If you need help quitting, ask your health care provider. Contact your health care provider if you have any questions. Keep all prenatal visits as told by your health care provider. This is important. This information is not intended to replace advice given to you by your health care provider. Make sure you discuss any questions you have with your health care provider. Document Released: 03/07/2001 Document Revised: 08/19/2015 Document Reviewed: 05/14/2012 Elsevier Interactive Patient Education  2017 [employer].

## 2023-04-13 LAB — INTEGRATED 2
AFP MoM: 0.68
Alpha-Fetoprotein: 24.5 ng/mL
Crown Rump Length: 63.8 mm
DIA MoM: 1.52
DIA Value: 259.1 pg/mL
Estriol, Unconjugated: 0.57 ng/mL
Gest. Age on Collection Date: 12.6 wk
Gestational Age: 16.7 wk
Maternal Age at EDD: 33 a
Nuchal Translucency (NT): 1.6 mm
Nuchal Translucency MoM: 0.9
Number of Fetuses: 1
PAPP-A MoM: 1.7
PAPP-A Value: 1923.1 ng/mL
Sonographer ID#: 309760
Test Results:: NEGATIVE
Weight: 138 [lb_av]
Weight: 138 [lb_av]
hCG MoM: 1.63
hCG Value: 53.4 [IU]/mL
uE3 MoM: 0.57

## 2023-04-16 ENCOUNTER — Encounter: Payer: Self-pay | Admitting: Women's Health

## 2023-04-16 DIAGNOSIS — F331 Major depressive disorder, recurrent, moderate: Secondary | ICD-10-CM | POA: Diagnosis not present

## 2023-04-18 DIAGNOSIS — F331 Major depressive disorder, recurrent, moderate: Secondary | ICD-10-CM | POA: Diagnosis not present

## 2023-04-23 DIAGNOSIS — F331 Major depressive disorder, recurrent, moderate: Secondary | ICD-10-CM | POA: Diagnosis not present

## 2023-04-30 DIAGNOSIS — F331 Major depressive disorder, recurrent, moderate: Secondary | ICD-10-CM | POA: Diagnosis not present

## 2023-05-02 ENCOUNTER — Ambulatory Visit: Payer: BC Managed Care – PPO | Admitting: Women's Health

## 2023-05-02 ENCOUNTER — Ambulatory Visit: Payer: BC Managed Care – PPO | Admitting: Radiology

## 2023-05-02 ENCOUNTER — Telehealth: Payer: Self-pay | Admitting: *Deleted

## 2023-05-02 ENCOUNTER — Encounter: Payer: Self-pay | Admitting: Women's Health

## 2023-05-02 VITALS — BP 105/78 | HR 68 | Wt 147.0 lb

## 2023-05-02 DIAGNOSIS — E039 Hypothyroidism, unspecified: Secondary | ICD-10-CM

## 2023-05-02 DIAGNOSIS — Z3A19 19 weeks gestation of pregnancy: Secondary | ICD-10-CM

## 2023-05-02 DIAGNOSIS — Z363 Encounter for antenatal screening for malformations: Secondary | ICD-10-CM

## 2023-05-02 DIAGNOSIS — Z3401 Encounter for supervision of normal first pregnancy, first trimester: Secondary | ICD-10-CM

## 2023-05-02 DIAGNOSIS — Z3402 Encounter for supervision of normal first pregnancy, second trimester: Secondary | ICD-10-CM

## 2023-05-02 DIAGNOSIS — O99282 Endocrine, nutritional and metabolic diseases complicating pregnancy, second trimester: Secondary | ICD-10-CM

## 2023-05-02 NOTE — Progress Notes (Signed)
 US :  GA = 19+4 weeks Single active fetus, Breech, FHR = 140 bpm Posterior pl, gr1,  MVP = 4.7 cm EFW 56% 313g,  Anatomy screen completed, no apparent abn seen. Normal ov's - cervical length = 4.4 cm,  closed

## 2023-05-02 NOTE — Progress Notes (Signed)
 LOW-RISK PREGNANCY VISIT Patient name: Tara Reyes MRN [id number]  Date of birth: 02/26/1991 Chief Complaint:   Routine Prenatal Visit and Pregnancy Ultrasound  History of Present Illness:   Tara Reyes is a 33 y.o. G25P0000 female at [redacted]w[redacted]d with an Estimated Date of Delivery: 09/22/23 being seen today for ongoing management of a low-risk pregnancy.   Today she reports no complaints. Contractions: Not present.  .  Movement: Present. denies leaking of fluid.     03/13/2023    1:56 PM 11/08/2022    9:15 AM 03/29/2022    8:20 AM 02/13/2022    7:43 AM 02/10/2021    7:43 AM  Depression screen PHQ 2/9  Decreased Interest 0 0 2 3 0  Down, Depressed, Hopeless 0 0 2 1 0  PHQ - 2 Score 0 0 4 4 0  Altered sleeping 0 0 1 1 0  Tired, decreased energy 0 0 1 1 0  Change in appetite 0 0 0 1 0  Feeling bad or failure about yourself  0 0 1 2 0  Trouble concentrating 0 0 2 2 0  Moving slowly or fidgety/restless 0 0 0 0 0  Suicidal thoughts 0 0 0 0 0  PHQ-9 Score 0 0 9 11 0  Difficult doing work/chores  Not difficult at all Somewhat difficult Somewhat difficult Not difficult at all        03/13/2023    1:56 PM 11/08/2022    9:16 AM  GAD 7 : Generalized Anxiety Score  Nervous, Anxious, on Edge 0 0  Control/stop worrying 0 0  Worry too much - different things 0 0  Trouble relaxing 0 0  Restless 0 0  Easily annoyed or irritable 0 0  Afraid - awful might happen 0 0  Total GAD 7 Score 0 0  Anxiety Difficulty  Not difficult at all      Review of Systems:   Pertinent items are noted in HPI Denies abnormal vaginal discharge w/ itching/odor/irritation, headaches, visual changes, shortness of breath, chest pain, abdominal pain, severe nausea/vomiting, or problems with urination or bowel movements unless otherwise stated above. Pertinent History Reviewed:  Reviewed past medical,surgical, social, obstetrical and family history.  Reviewed problem list, medications and allergies. Physical  Assessment:   Vitals:   05/02/23 0926  BP: 105/78  Pulse: 68  Weight: 147 lb (66.7 kg)  Body mass index is 26.89 kg/m.        Physical Examination:   General appearance: Well appearing, and in no distress  Mental status: Alert, oriented to person, place, and time  Skin: Warm & dry  Cardiovascular: Normal heart rate noted  Respiratory: Normal respiratory effort, no distress  Abdomen: Soft, gravid, nontender  Pelvic: Cervical exam deferred         Extremities: Edema: None  Fetal Status:     Movement: Present   US :  GA = 19+4 weeks Single active fetus, Breech, FHR = 140 bpm Posterior pl, gr1,  MVP = 4.7 cm EFW 56% 313g,  Anatomy screen completed, no apparent abn seen. Normal ov's - cervical length = 4.4 cm,  closed  Chaperone: N/A   No results found for this or any previous visit (from the past 24 hours).  Assessment & Plan:  1) Low-risk pregnancy G1P0000 at [redacted]w[redacted]d with an Estimated Date of Delivery: 09/22/23   2) Hypothyroidism, on Synthroid  , check TSH today   Meds: No orders of the defined types were placed in this encounter.  Labs/procedures today: U/S  Plan:  Continue routine obstetrical care  Next visit: prefers online    Reviewed: Preterm labor symptoms and general obstetric precautions including but not limited to vaginal bleeding, contractions, leaking of fluid and fetal movement were reviewed in detail with the patient.  All questions were answered. Does have home bp cuff. Office bp cuff given: not applicable. Check bp weekly, let us  know if consistently >140 and/or >90.  Follow-up: Return in about 4 weeks (around 05/30/2023) for LROB, CNM, MyChart Video.  No future appointments.  Orders Placed This Encounter  Procedures   TSH   Suzen JONELLE Fetters CNM, [facility] 05/02/2023 9:52 AM

## 2023-05-02 NOTE — Telephone Encounter (Signed)
 Left patient a message to call and schedule appointment on wait list.

## 2023-05-02 NOTE — Patient Instructions (Addendum)
 Tara Reyes, thank you for choosing our office today! We appreciate the opportunity to meet your healthcare needs. You may receive a short survey by mail, e-mail, or through [employer]. If you are happy with your care we would appreciate if you could take just a few minutes to complete the survey questions. We read all of your comments and take your feedback very seriously. Thank you again for choosing our office.  Center for [employer] Team at [facility] at [facility] ([address] [place], [place] [address]) Entrance C, located off of [address] Free 24/7 valet parking  Go to [email or web address] to register for FREE online childbirth classes  Call the office [phone]) or go to [facility] if: You begin to severe cramping Your water  breaks.  Sometimes it is a big gush of fluid, sometimes it is just a trickle that keeps getting your panties wet or running down your legs You have vaginal bleeding.  It is normal to have a small amount of spotting if your cervix was checked.   [facility] Pediatricians/Family Doctors [facility]): [address] Luba BROCKS, [phone]           [facility]: [address], [phone]                [facility]): [address], [phone] (call to ask if accepting patients) [facility] Department: [address], [place], [phone]    [facility] Pediatricians/Family Doctors [facility]): [phone] [address], [phone] [facility]: [address] [place], [phone] [facility] [place]: [address], [phone]  [facility] Doctors  [facility] [phone] [facility]: [address], [phone]   [facility] Doctors [facility]: [address], [phone]  [facility] Doctors  [facility]: [phone], [phone]  Home Blood Pressure Monitoring for Patients   Your provider has recommended that you check your blood pressure (BP) at least once a week at home. If you do not have a blood pressure cuff at home, one will be provided for you. Contact your provider if you have not received your monitor within 1 week.   Helpful Tips for Accurate Home Blood Pressure Checks  Don't smoke, exercise, or drink caffeine 30 minutes before checking your BP Use the restroom before checking your BP (a full bladder can raise your pressure) Relax in a comfortable upright chair Feet on the ground Left arm resting comfortably on a flat surface at the level of your heart Legs uncrossed Back supported Sit quietly and don't talk Place the cuff on your bare arm Adjust snuggly, so that only two fingertips can fit between your skin and the top of the cuff Check 2 readings separated by at least one minute Keep a log of your BP readings For a visual, please reference this diagram: http://ccnc.care/bpdiagram  Provider Name: [facility]     Phone: [phone]  Zone 1: ALL CLEAR  Continue to monitor your symptoms:  BP reading is less than 140 (top number) or less than 90 (bottom number)  No right upper stomach pain No headaches or seeing spots No feeling nauseated or throwing up No swelling in face and hands  Zone 2: CAUTION Call your doctor's office for any of the following:  BP reading is greater than 140 (top number) or greater than  90 (bottom number)  Stomach pain under your ribs in the middle or right side Headaches or seeing spots Feeling nauseated or throwing up Swelling in face and hands  Zone 3: EMERGENCY  Seek immediate medical care if you have any of the following:  BP reading is greater than160 (top number) or greater than 110 (bottom number) Severe headaches not improving with Tylenol  Serious difficulty catching your breath Any worsening symptoms from  Zone 2     Second Trimester of Pregnancy The second trimester is from week 14 through week 27 (months 4 through 6). The second trimester is often a time when you feel your best. Your body has adjusted to being pregnant, and you begin to feel better physically. Usually, morning sickness has lessened or quit completely, you may have more energy, and you may have an increase in appetite. The second trimester is also a time when the fetus is growing rapidly. At the end of the sixth month, the fetus is about 9 inches long and weighs about 1 pounds. You will likely begin to feel the baby move (quickening) between 16 and 20 weeks of pregnancy. Body changes during your second trimester Your body continues to go through many changes during your second trimester. The changes vary from woman to woman. Your weight will continue to increase. You will notice your lower abdomen bulging out. You may begin to get stretch marks on your hips, abdomen, and breasts. You may develop headaches that can be relieved by medicines. The medicines should be approved by your health care provider. You may urinate more often because the fetus is pressing on your bladder. You may develop or continue to have heartburn as a result of your pregnancy. You may develop constipation because certain hormones are causing the muscles that push waste through your intestines to slow down. You may develop hemorrhoids or swollen, bulging veins (varicose veins). You may have back pain. This is caused by: Weight gain. Pregnancy hormones that are relaxing the joints in your pelvis. A shift in weight and the muscles that support your balance. Your breasts will continue to grow and they will continue to become tender. Your gums may bleed and may be sensitive to brushing and flossing. Dark spots or blotches (chloasma, mask of pregnancy) may develop on your face. This will likely fade after the baby is born. A dark line from your belly button to  the pubic area (linea nigra) may appear. This will likely fade after the baby is born. You may have changes in your hair. These can include thickening of your hair, rapid growth, and changes in texture. Some women also have hair loss during or after pregnancy, or hair that feels dry or thin. Your hair will most likely return to normal after your baby is born.  What to expect at prenatal visits During a routine prenatal visit: You will be weighed to make sure you and the fetus are growing normally. Your blood pressure will be taken. Your abdomen will be measured to track your baby's growth. The fetal heartbeat will be listened to. Any test results from the previous visit will be discussed.  Your health care provider may ask you: How you are feeling. If you are feeling the baby move. If you have had any abnormal symptoms, such as leaking fluid, bleeding, severe headaches, or abdominal cramping. If you are using any tobacco products, including cigarettes, chewing tobacco, and electronic cigarettes. If you have any questions.  Other tests that may be performed during  your second trimester include: Blood tests that check for: Low iron levels (anemia). High blood sugar that affects pregnant women (gestational diabetes) between 85 and 28 weeks. Rh antibodies. This is to check for a protein on red blood cells (Rh factor). Urine tests to check for infections, diabetes, or protein in the urine. An ultrasound to confirm the proper growth and development of the baby. An amniocentesis to check for possible genetic problems. Fetal screens for spina bifida and Down syndrome. HIV (human immunodeficiency virus) testing. Routine prenatal testing includes screening for HIV, unless you choose not to have this test.  Follow these instructions at home: Medicines Follow your health care provider's instructions regarding medicine use. Specific medicines may be either safe or unsafe to take during  pregnancy. Take a prenatal vitamin that contains at least 600 micrograms (mcg) of folic acid. If you develop constipation, try taking a stool softener if your health care provider approves. Eating and drinking Eat a balanced diet that includes fresh fruits and vegetables, whole grains, good sources of protein such as meat, eggs, or tofu, and low-fat dairy. Your health care provider will help you determine the amount of weight gain that is right for you. Avoid raw meat and uncooked cheese. These carry germs that can cause birth defects in the baby. If you have low calcium intake from food, talk to your health care provider about whether you should take a daily calcium supplement. Limit foods that are high in fat and processed sugars, such as fried and sweet foods. To prevent constipation: Drink enough fluid to keep your urine clear or pale yellow. Eat foods that are high in fiber, such as fresh fruits and vegetables, whole grains, and beans. Activity Exercise only as directed by your health care provider. Most women can continue their usual exercise routine during pregnancy. Try to exercise for 30 minutes at least 5 days a week. Stop exercising if you experience uterine contractions. Avoid heavy lifting, wear low heel shoes, and practice good posture. A sexual relationship may be continued unless your health care provider directs you otherwise. Relieving pain and discomfort Wear a good support bra to prevent discomfort from breast tenderness. Take warm sitz baths to soothe any pain or discomfort caused by hemorrhoids. Use hemorrhoid cream if your health care provider approves. Rest with your legs elevated if you have leg cramps or low back pain. If you develop varicose veins, wear support hose. Elevate your feet for 15 minutes, 3-4 times a day. Limit salt in your diet. Prenatal Care Write down your questions. Take them to your prenatal visits. Keep all your prenatal visits as told by your health  care provider. This is important. Safety Wear your seat belt at all times when driving. Make a list of emergency phone numbers, including numbers for family, friends, the hospital, and police and fire departments. General instructions Ask your health care provider for a referral to a local prenatal education class. Begin classes no later than the beginning of month 6 of your pregnancy. Ask for help if you have counseling or nutritional needs during pregnancy. Your health care provider can offer advice or refer you to specialists for help with various needs. Do not use hot tubs, steam rooms, or saunas. Do not douche or use tampons or scented sanitary pads. Do not cross your legs for long periods of time. Avoid cat litter boxes and soil used by cats. These carry germs that can cause birth defects in the baby and possibly loss of the  fetus by miscarriage or stillbirth. Avoid all smoking, herbs, alcohol, and unprescribed drugs. Chemicals in these products can affect the formation and growth of the baby. Do not use any products that contain nicotine or tobacco, such as cigarettes and e-cigarettes. If you need help quitting, ask your health care provider. Visit your dentist if you have not gone yet during your pregnancy. Use a soft toothbrush to brush your teeth and be gentle when you floss. Contact a health care provider if: You have dizziness. You have mild pelvic cramps, pelvic pressure, or nagging pain in the abdominal area. You have persistent nausea, vomiting, or diarrhea. You have a bad smelling vaginal discharge. You have pain when you urinate. Get help right away if: You have a fever. You are leaking fluid from your vagina. You have spotting or bleeding from your vagina. You have severe abdominal cramping or pain. You have rapid weight gain or weight loss. You have shortness of breath with chest pain. You notice sudden or extreme swelling of your face, hands, ankles, feet, or legs. You  have not felt your baby move in over an hour. You have severe headaches that do not go away when you take medicine. You have vision changes. Summary The second trimester is from week 14 through week 27 (months 4 through 6). It is also a time when the fetus is growing rapidly. Your body goes through many changes during pregnancy. The changes vary from woman to woman. Avoid all smoking, herbs, alcohol, and unprescribed drugs. These chemicals affect the formation and growth your baby. Do not use any tobacco products, such as cigarettes, chewing tobacco, and e-cigarettes. If you need help quitting, ask your health care provider. Contact your health care provider if you have any questions. Keep all prenatal visits as told by your health care provider. This is important. This information is not intended to replace advice given to you by your health care provider. Make sure you discuss any questions you have with your health care provider. Document Released: 03/07/2001 Document Revised: 08/19/2015 Document Reviewed: 05/14/2012 Elsevier Interactive Patient Education  2017 Elsevier Inc.   AREA PEDIATRIC/FAMILY PRACTICE PHYSICIANS  [facility] [address] [place], [place] [address] Phone - [phone]   Fax - [phone]  JACK AMOS [address] [place], [place]  [address] Phone - [phone]   Fax - [phone]  [facility] [address] [place], [place]  [address] Phone - [phone]   Fax - [phone]  [facility] [address] [place], [place]  [address] Phone - [phone]   Fax - [phone]  [facility] [address] [place], [place]  [address] Phone - [phone]   Fax - [phone]  [facility] [address] [place], [place]  [address] Phone - [phone]   Fax - [phone]  [facility] [place] [address] [place], [place]  [address] Phone - [phone]   Fax - [phone]  [facility] FAMILY MEDICINE AT [facility] [address] [place], [address] [place], [place]  [address] Phone - [phone]   Fax - [phone]  [facility] MEDICINE AT [facility] [address] [place], [place]  [address] Phone - [phone]   Fax - [phone] [facility] AT [place] [address] [place], [place]  [address] Phone - [phone]   Fax - [phone]  [facility] [address] [place]  [address] Phone - [phone]   Fax - [phone]  [facility] [address], [address] [place], [place]  [address] Phone - [phone]   Fax - [phone]  [facility] [place] [address] [place], [place]  [address] Phone - [phone]   Fax - [phone]  [facility] [address] [place], [place]  [address] Phone - [phone]  [facility] [address] [place], [place]  [address] Phone - [phone]   Fax - [phone]  [facility] [address] [place], [place]  [address] Phone - [phone]   Fax - [phone]  [facility] [address] [place], [place]  [address] Phone - [phone]   Fax - [phone]  [facility] [address] [place], [place]  [address] Phone - [phone]   Fax - [phone]   [facility] [address] [place], [place]  [address] Phone - [phone]   Fax - [phone]  [facility] [address] [place], [place]  [address] Phone - [phone]   Fax - [phone]  DAVID RUBIN [address] [place], [place]  [address] Phone - [phone]   Fax - [phone]  [facility] PRACTICE [address] [place], [place]  [address] Phone - [phone]   Fax - [phone]  [place] [facility]  [place] [address] [place], [place]  [address] Phone - [phone]   Fax - [phone] [facility] [address] [place], [place]  [address] Phone - [phone]   Fax - [phone]  [facility] [address] [place], [place]  [address] Phone - [phone]   Fax - [phone]  [facility] - [place] [address] [place], [place]  [address] Phone - [phone]   Fax - [phone]

## 2023-05-03 ENCOUNTER — Other Ambulatory Visit: Payer: Self-pay | Admitting: Women's Health

## 2023-05-03 ENCOUNTER — Encounter: Payer: Self-pay | Admitting: Women's Health

## 2023-05-03 ENCOUNTER — Other Ambulatory Visit (HOSPITAL_COMMUNITY): Payer: Self-pay

## 2023-05-03 LAB — TSH: TSH: 4.67 u[IU]/mL — ABNORMAL HIGH (ref 0.450–4.500)

## 2023-05-03 MED ORDER — LEVOTHYROXINE SODIUM 75 MCG PO TABS
75.0000 ug | ORAL_TABLET | Freq: Every day | ORAL | 6 refills | Status: DC
  Filled 2023-05-03: qty 30, 30d supply, fill #0

## 2023-05-03 NOTE — Addendum Note (Signed)
 Addended by: Ferd Householder on: 05/03/2023 10:29 AM   Modules accepted: Orders

## 2023-05-07 DIAGNOSIS — F331 Major depressive disorder, recurrent, moderate: Secondary | ICD-10-CM | POA: Diagnosis not present

## 2023-05-14 ENCOUNTER — Other Ambulatory Visit (HOSPITAL_COMMUNITY): Payer: Self-pay

## 2023-05-14 DIAGNOSIS — F331 Major depressive disorder, recurrent, moderate: Secondary | ICD-10-CM | POA: Diagnosis not present

## 2023-05-16 DIAGNOSIS — F331 Major depressive disorder, recurrent, moderate: Secondary | ICD-10-CM | POA: Diagnosis not present

## 2023-05-18 ENCOUNTER — Other Ambulatory Visit: Payer: Self-pay | Admitting: Family Medicine

## 2023-05-21 ENCOUNTER — Other Ambulatory Visit: Payer: Self-pay | Admitting: Women's Health

## 2023-05-21 DIAGNOSIS — F331 Major depressive disorder, recurrent, moderate: Secondary | ICD-10-CM | POA: Diagnosis not present

## 2023-05-21 MED ORDER — LEVOTHYROXINE SODIUM 50 MCG PO TABS: 50.0000 ug | ORAL_TABLET | Freq: Every day | ORAL | 1 refills | Status: DC

## 2023-05-24 ENCOUNTER — Other Ambulatory Visit: Payer: Self-pay | Admitting: Advanced Practice Midwife

## 2023-06-04 DIAGNOSIS — F331 Major depressive disorder, recurrent, moderate: Secondary | ICD-10-CM | POA: Diagnosis not present

## 2023-06-06 ENCOUNTER — Ambulatory Visit (INDEPENDENT_AMBULATORY_CARE_PROVIDER_SITE_OTHER): Payer: BC Managed Care – PPO | Admitting: Obstetrics and Gynecology

## 2023-06-06 ENCOUNTER — Encounter: Payer: Self-pay | Admitting: Obstetrics and Gynecology

## 2023-06-06 ENCOUNTER — Telehealth: Payer: BC Managed Care – PPO | Admitting: Advanced Practice Midwife

## 2023-06-06 VITALS — BP 111/76 | HR 92 | Wt 152.0 lb

## 2023-06-06 DIAGNOSIS — Z3401 Encounter for supervision of normal first pregnancy, first trimester: Secondary | ICD-10-CM | POA: Diagnosis not present

## 2023-06-06 DIAGNOSIS — Z3A24 24 weeks gestation of pregnancy: Secondary | ICD-10-CM | POA: Diagnosis not present

## 2023-06-06 DIAGNOSIS — F339 Major depressive disorder, recurrent, unspecified: Secondary | ICD-10-CM

## 2023-06-06 DIAGNOSIS — E039 Hypothyroidism, unspecified: Secondary | ICD-10-CM | POA: Diagnosis not present

## 2023-06-06 NOTE — Progress Notes (Addendum)
   PRENATAL VISIT NOTE  Subjective:  Tara Reyes is a 33 y.o. G1P0000 at [redacted]w[redacted]d being seen today for ongoing prenatal care.  She is currently monitored for the following issues for this low-risk pregnancy and has Depression, recurrent (HCC); Hypothyroid; and Supervision of normal first pregnancy on their problem list.  Patient reports no complaints.  Contractions: Not present. Vag. Bleeding: None.  Movement: Present. Denies leaking of fluid.   The following portions of the patient's history were reviewed and updated as appropriate: allergies, current medications, past family history, past medical history, past social history, past surgical history and problem list.   Objective:   Vitals:   06/06/23 0926  BP: 111/76  Pulse: 92  Weight: 152 lb (68.9 kg)    Fetal Status: Fetal Heart Rate (bpm): 140 Fundal Height: 23 cm Movement: Present     General:  Alert, oriented and cooperative. Patient is in no acute distress.  Skin: Skin is warm and dry. No rash noted.   Cardiovascular: Normal heart rate noted  Respiratory: Normal respiratory effort, no problems with respiration noted  Abdomen: Soft, gravid, appropriate for gestational age.  Pain/Pressure: Present     Pelvic: Cervical exam deferred        Extremities: Normal range of motion.  Edema: None  Mental Status: Normal mood and affect. Normal behavior. Normal judgment and thought content.   Assessment and Plan:  Pregnancy: G1P0000 at [redacted]w[redacted]d 1. Hypothyroidism, unspecified type (Primary) Recheck TSH. Was slightly elevated last visit but was not taking Synthroid consistently due to N/V.   2. Encounter for supervision of normal first pregnancy in first trimester 28 week labs next visit Offer TDAP next visit.  Discussed support bands for symphysis discomfort.   3. Depression, recurrent (HCC) Continue prozac  4. Pregnancy with 24 completed weeks gestation   Preterm labor symptoms and general obstetric precautions including but  not limited to vaginal bleeding, contractions, leaking of fluid and fetal movement were reviewed in detail with the patient. Please refer to After Visit Summary for other counseling recommendations.   Return in about 4 weeks (around 07/04/2023) for OB VISIT, MD or APP, 2 hr GTT/28w labs.  No future appointments.   Milas Hock, MD

## 2023-06-07 ENCOUNTER — Other Ambulatory Visit (HOSPITAL_COMMUNITY): Payer: Self-pay

## 2023-06-07 ENCOUNTER — Encounter: Payer: Self-pay | Admitting: Obstetrics and Gynecology

## 2023-06-07 ENCOUNTER — Other Ambulatory Visit: Payer: Self-pay | Admitting: Obstetrics and Gynecology

## 2023-06-07 DIAGNOSIS — E039 Hypothyroidism, unspecified: Secondary | ICD-10-CM

## 2023-06-07 LAB — TSH: TSH: 5.67 u[IU]/mL — ABNORMAL HIGH (ref 0.450–4.500)

## 2023-06-07 MED ORDER — LEVOTHYROXINE SODIUM 75 MCG PO TABS
75.0000 ug | ORAL_TABLET | Freq: Every day | ORAL | 1 refills | Status: DC
  Filled 2023-06-07: qty 30, 30d supply, fill #0
  Filled 2023-07-09: qty 30, 30d supply, fill #1

## 2023-06-11 DIAGNOSIS — F331 Major depressive disorder, recurrent, moderate: Secondary | ICD-10-CM | POA: Diagnosis not present

## 2023-06-13 DIAGNOSIS — F331 Major depressive disorder, recurrent, moderate: Secondary | ICD-10-CM | POA: Diagnosis not present

## 2023-06-18 DIAGNOSIS — F331 Major depressive disorder, recurrent, moderate: Secondary | ICD-10-CM | POA: Diagnosis not present

## 2023-06-25 DIAGNOSIS — F331 Major depressive disorder, recurrent, moderate: Secondary | ICD-10-CM | POA: Diagnosis not present

## 2023-06-26 DIAGNOSIS — F331 Major depressive disorder, recurrent, moderate: Secondary | ICD-10-CM | POA: Diagnosis not present

## 2023-07-03 NOTE — Progress Notes (Unsigned)
   PRENATAL VISIT NOTE  Subjective:  Tara Reyes is a 33 y.o. G1P0000 at [redacted]w[redacted]d being seen today for ongoing prenatal care.  She is currently monitored for the following issues for this low-risk pregnancy and has Depression, recurrent (HCC); Hypothyroid; and Supervision of normal first pregnancy on their problem list.  Patient reports {[id number]}.   .  .   . Denies leaking of fluid.   The following portions of the patient's history were reviewed and updated as appropriate: allergies, current medications, past family history, past medical history, past social history, past surgical history and problem list.   Objective:  There were no vitals filed for this visit.  Fetal Status:           General:  Alert, oriented and cooperative. Patient is in no acute distress.  Skin: Skin is warm and dry. No rash noted.   Cardiovascular: Normal heart rate noted  Respiratory: Normal respiratory effort, no problems with respiration noted  Abdomen: Soft, gravid, appropriate for gestational age.        Pelvic: Cervical exam deferred        Extremities: Normal range of motion.     Mental Status: Normal mood and affect. Normal behavior. Normal judgment and thought content.   Assessment and Plan:  Pregnancy: G1P0000 at [redacted]w[redacted]d 1. Encounter for supervision of normal first pregnancy in third trimester (Primary) 28w labs Offered TDAP - pt ***  2. Hypothyroidism, unspecified type Dose adjusted to 75 mcg last visit. Recheck TSH today.   3. Depression, recurrent (HCC) Continue Prozac  4. Pregnancy with 28 completed weeks gestation   Preterm labor symptoms and general obstetric precautions including but not limited to vaginal bleeding, contractions, leaking of fluid and fetal movement were reviewed in detail with the patient. Please refer to After Visit Summary for other counseling recommendations.   No follow-ups on file.  Future Appointments  Date Time Provider Department Center  07/04/2023  8:10 AM  Milas Hock, MD [facility]    Milas Hock, MD

## 2023-07-04 ENCOUNTER — Encounter: Payer: Self-pay | Admitting: Obstetrics and Gynecology

## 2023-07-04 ENCOUNTER — Ambulatory Visit (INDEPENDENT_AMBULATORY_CARE_PROVIDER_SITE_OTHER): Admitting: Obstetrics and Gynecology

## 2023-07-04 VITALS — BP 125/79 | HR 99 | Wt 157.1 lb

## 2023-07-04 DIAGNOSIS — Z23 Encounter for immunization: Secondary | ICD-10-CM | POA: Diagnosis not present

## 2023-07-04 DIAGNOSIS — O99343 Other mental disorders complicating pregnancy, third trimester: Secondary | ICD-10-CM

## 2023-07-04 DIAGNOSIS — O99283 Endocrine, nutritional and metabolic diseases complicating pregnancy, third trimester: Secondary | ICD-10-CM | POA: Diagnosis not present

## 2023-07-04 DIAGNOSIS — E039 Hypothyroidism, unspecified: Secondary | ICD-10-CM | POA: Diagnosis not present

## 2023-07-04 DIAGNOSIS — Z3A28 28 weeks gestation of pregnancy: Secondary | ICD-10-CM | POA: Diagnosis not present

## 2023-07-04 DIAGNOSIS — F339 Major depressive disorder, recurrent, unspecified: Secondary | ICD-10-CM

## 2023-07-04 DIAGNOSIS — Z3403 Encounter for supervision of normal first pregnancy, third trimester: Secondary | ICD-10-CM

## 2023-07-04 NOTE — Progress Notes (Signed)
Denies any concerns.

## 2023-07-05 ENCOUNTER — Encounter: Payer: Self-pay | Admitting: Obstetrics and Gynecology

## 2023-07-05 LAB — CBC
Hematocrit: 37.4 % (ref 34.0–46.6)
Hemoglobin: 12.9 g/dL (ref 11.1–15.9)
MCH: 31.2 pg (ref 26.6–33.0)
MCHC: 34.5 g/dL (ref 31.5–35.7)
MCV: 91 fL (ref 79–97)
Platelets: 195 10*3/uL (ref 150–450)
RBC: 4.13 x10E6/uL (ref 3.77–5.28)
RDW: 11.6 % — ABNORMAL LOW (ref 11.7–15.4)
WBC: 9.4 10*3/uL (ref 3.4–10.8)

## 2023-07-05 LAB — TSH: TSH: 2.27 u[IU]/mL (ref 0.450–4.500)

## 2023-07-05 LAB — RPR: RPR Ser Ql: NONREACTIVE

## 2023-07-05 LAB — GLUCOSE TOLERANCE, 2 HOURS W/ 1HR
Glucose, 1 hour: 130 mg/dL (ref 70–179)
Glucose, 2 hour: 87 mg/dL (ref 70–152)
Glucose, Fasting: 80 mg/dL (ref 70–91)

## 2023-07-05 LAB — HIV ANTIBODY (ROUTINE TESTING W REFLEX): HIV Screen 4th Generation wRfx: NONREACTIVE

## 2023-07-09 DIAGNOSIS — F331 Major depressive disorder, recurrent, moderate: Secondary | ICD-10-CM | POA: Diagnosis not present

## 2023-07-10 ENCOUNTER — Other Ambulatory Visit: Payer: Self-pay

## 2023-07-14 ENCOUNTER — Other Ambulatory Visit (HOSPITAL_COMMUNITY): Payer: Self-pay

## 2023-07-16 DIAGNOSIS — F331 Major depressive disorder, recurrent, moderate: Secondary | ICD-10-CM | POA: Diagnosis not present

## 2023-07-18 ENCOUNTER — Ambulatory Visit (INDEPENDENT_AMBULATORY_CARE_PROVIDER_SITE_OTHER): Admitting: Obstetrics and Gynecology

## 2023-07-18 VITALS — BP 105/71 | HR 99 | Wt 157.0 lb

## 2023-07-18 DIAGNOSIS — Z3A30 30 weeks gestation of pregnancy: Secondary | ICD-10-CM | POA: Diagnosis not present

## 2023-07-18 DIAGNOSIS — E039 Hypothyroidism, unspecified: Secondary | ICD-10-CM

## 2023-07-18 DIAGNOSIS — F339 Major depressive disorder, recurrent, unspecified: Secondary | ICD-10-CM

## 2023-07-18 DIAGNOSIS — Z3403 Encounter for supervision of normal first pregnancy, third trimester: Secondary | ICD-10-CM | POA: Diagnosis not present

## 2023-07-18 NOTE — Progress Notes (Signed)
   PRENATAL VISIT NOTE  Subjective:  Tara Reyes is a 33 y.o. G1P0000 at [redacted]w[redacted]d being seen today for ongoing prenatal care.  She is currently monitored for the following issues for this low-risk pregnancy and has Depression, recurrent (HCC); Hypothyroid; and Supervision of normal first pregnancy on their problem list.  Patient reports no complaints.  Contractions: Not present. Vag. Bleeding: None.  Movement: Present. Denies leaking of fluid.   The following portions of the patient's history were reviewed and updated as appropriate: allergies, current medications, past family history, past medical history, past social history, past surgical history and problem list.   Objective:   Vitals:   07/18/23 0837  BP: 105/71  Pulse: 99  Weight: 157 lb (71.2 kg)    Fetal Status: Fetal Heart Rate (bpm): 140 Fundal Height: 30 cm Movement: Present     General:  Alert, oriented and cooperative. Patient is in no acute distress.  Skin: Skin is warm and dry. No rash noted.   Cardiovascular: Normal heart rate noted  Respiratory: Normal respiratory effort, no problems with respiration noted  Abdomen: Soft, gravid, appropriate for gestational age.  Pain/Pressure: Absent      Assessment and Plan:  Pregnancy: G1P0000 at [redacted]w[redacted]d 1. Encounter for supervision of normal first pregnancy in third trimester (Primary) 2. [redacted] weeks gestation of pregnancy Normal third tri labs  3. Depression, recurrent (HCC) Continue prozac , no complaints  4. Hypothyroidism, unspecified type TSH 2.27 @ 28wk, continue synthroid   Please refer to After Visit Summary for other counseling recommendations.   Return in about 2 weeks (around 08/01/2023) for return OB at 32 weeks.  Future Appointments  Date Time Provider Department Center  08/01/2023  8:10 AM Izell Marsh, MD [facility]  08/15/2023 10:30 AM Izell Marsh, MD [facility]    Izell Marsh, MD

## 2023-07-23 DIAGNOSIS — F331 Major depressive disorder, recurrent, moderate: Secondary | ICD-10-CM | POA: Diagnosis not present

## 2023-07-30 ENCOUNTER — Other Ambulatory Visit: Payer: Self-pay | Admitting: Obstetrics and Gynecology

## 2023-07-30 DIAGNOSIS — E039 Hypothyroidism, unspecified: Secondary | ICD-10-CM

## 2023-07-30 DIAGNOSIS — F331 Major depressive disorder, recurrent, moderate: Secondary | ICD-10-CM | POA: Diagnosis not present

## 2023-08-01 ENCOUNTER — Ambulatory Visit: Admitting: Obstetrics and Gynecology

## 2023-08-01 VITALS — BP 99/69 | HR 109 | Wt 161.0 lb

## 2023-08-01 DIAGNOSIS — Z3403 Encounter for supervision of normal first pregnancy, third trimester: Secondary | ICD-10-CM | POA: Diagnosis not present

## 2023-08-01 DIAGNOSIS — E039 Hypothyroidism, unspecified: Secondary | ICD-10-CM

## 2023-08-01 DIAGNOSIS — F339 Major depressive disorder, recurrent, unspecified: Secondary | ICD-10-CM

## 2023-08-01 DIAGNOSIS — Z3A32 32 weeks gestation of pregnancy: Secondary | ICD-10-CM | POA: Diagnosis not present

## 2023-08-01 NOTE — Progress Notes (Signed)
   PRENATAL VISIT NOTE  Subjective:  Tara Reyes is a 33 y.o. G1P0000 at [redacted]w[redacted]d being seen today for ongoing prenatal care.  She is currently monitored for the following issues for this low-risk pregnancy and has Depression, recurrent (HCC); Hypothyroid; and Supervision of normal first pregnancy on their problem list.  Patient reports  doing well. Notes that her resting heart rate has been rising (tracks on watch) - its been in low 90s. No CP/SOB/palpitations. Exercises regularly with [facility] .  Contractions: Not present. Vag. Bleeding: None.  Movement: Present. Denies leaking of fluid.   The following portions of the patient's history were reviewed and updated as appropriate: allergies, current medications, past family history, past medical history, past social history, past surgical history and problem list.   Objective:   Vitals:   08/01/23 0813  BP: 99/69  Pulse: (!) 109  Weight: 161 lb (73 kg)    Fetal Status: Fetal Heart Rate (bpm): 130 Fundal Height: 32 cm Movement: Present     General:  Alert, oriented and cooperative. Patient is in no acute distress.  Skin: Skin is warm and dry. No rash noted.   Cardiovascular: Normal heart rate noted  Respiratory: Normal respiratory effort, no problems with respiration noted  Abdomen: Soft, gravid, appropriate for gestational age.  Pain/Pressure: Absent      Assessment and Plan:  Pregnancy: G1P0000 at [redacted]w[redacted]d 1. [redacted] weeks gestation of pregnancy (Primary) 2. Encounter for supervision of normal first pregnancy in third trimester Pediatrician: [facility] Discussed warning si/sx related to heart rate but that heart rate in low 90s is acceptable for pregnancy  3. Hypothyroidism, unspecified type TSH 2.27 @ 28wk, continue synthroid   Can consider repeat TSH next visit  4. Depression, recurrent (HCC) Continue prozac , no complaints   Preterm labor symptoms and general obstetric precautions including but not  limited to vaginal bleeding, contractions, leaking of fluid and fetal movement were reviewed in detail with the patient. Please refer to After Visit Summary for other counseling recommendations.   Return in about 2 weeks (around 08/15/2023) for return OB at 34 weeks.  Future Appointments  Date Time Provider Department Center  08/15/2023 10:30 AM Izell Marsh, MD [facility]  08/29/2023  8:30 AM Lacey Pian, MD [facility]  09/05/2023  8:10 AM Lacey Pian, MD [facility]  09/12/2023  8:30 AM Izell Marsh, MD [facility]  09/19/2023  8:30 AM Izell Marsh, MD [facility]    Izell Marsh, MD

## 2023-08-02 ENCOUNTER — Other Ambulatory Visit (HOSPITAL_COMMUNITY): Payer: Self-pay

## 2023-08-06 ENCOUNTER — Other Ambulatory Visit: Payer: Self-pay

## 2023-08-06 ENCOUNTER — Encounter: Payer: Self-pay | Admitting: Obstetrics and Gynecology

## 2023-08-06 DIAGNOSIS — E039 Hypothyroidism, unspecified: Secondary | ICD-10-CM

## 2023-08-06 MED ORDER — LEVOTHYROXINE SODIUM 75 MCG PO TABS: 75.0000 ug | ORAL_TABLET | Freq: Every day | ORAL | 1 refills | Status: DC

## 2023-08-13 DIAGNOSIS — F331 Major depressive disorder, recurrent, moderate: Secondary | ICD-10-CM | POA: Diagnosis not present

## 2023-08-15 ENCOUNTER — Encounter: Admitting: Obstetrics and Gynecology

## 2023-08-16 NOTE — Progress Notes (Signed)
   PRENATAL VISIT NOTE  Subjective:  Tara Reyes is a 33 y.o. G1P0000 at [redacted]w[redacted]d being seen today for ongoing prenatal care.  She is currently monitored for the following issues for this low-risk pregnancy and has Depression, recurrent (HCC); Hypothyroid; and Supervision of normal first pregnancy on their problem list.  Patient reports no complaints.  Contractions: Not present. Vag. Bleeding: None.  Movement: Present. Denies leaking of fluid.   The following portions of the patient's history were reviewed and updated as appropriate: allergies, current medications, past family history, past medical history, past social history, past surgical history and problem list.   Objective:    Vitals:   08/17/23 0911  BP: 116/76  Pulse: 92  Weight: 164 lb 0.6 oz (74.4 kg)    Fetal Status:  Fetal Heart Rate (bpm): 141 Fundal Height: 34 cm Movement: Present Presentation: Vertex  General: Alert, oriented and cooperative. Patient is in no acute distress.  Skin: Skin is warm and dry. No rash noted.   Cardiovascular: Normal heart rate noted  Respiratory: Normal respiratory effort, no problems with respiration noted  Abdomen: Soft, gravid, appropriate for gestational age.  Pain/Pressure: Absent     Pelvic: Cervical exam deferred        Extremities: Normal range of motion.  Edema: None  Mental Status: Normal mood and affect. Normal behavior. Normal judgment and thought content.   Assessment and Plan:  Pregnancy: G1P0000 at [redacted]w[redacted]d 1. Encounter for supervision of normal first pregnancy in third trimester (Primary) - Doing well, feeling regular and vigorous fetal movement   2. [redacted] weeks gestation of pregnancy - FH appropriate - Counseled on labor readiness.   3. Hypothyroidism, unspecified type - Synthroid , stable  4. Depression, recurrent (HCC) - Prozac  - stable.   Preterm labor symptoms and general obstetric precautions including but not limited to vaginal bleeding, contractions, leaking  of fluid and fetal movement were reviewed in detail with the patient. Please refer to After Visit Summary for other counseling recommendations.   Return in about 2 weeks (around 08/31/2023) for LOB with GBS.  Future Appointments  Date Time Provider Department Center  08/29/2023  8:30 AM Lacey Pian, MD [facility]  09/05/2023  8:10 AM Lacey Pian, MD [facility]  09/12/2023  8:30 AM Izell Marsh, MD [facility]  09/19/2023  8:30 AM Izell Marsh, MD [facility]    Salomon Cree, CNM

## 2023-08-17 ENCOUNTER — Ambulatory Visit: Admitting: Certified Nurse Midwife

## 2023-08-17 VITALS — BP 116/76 | HR 92 | Wt 164.0 lb

## 2023-08-17 DIAGNOSIS — Z3A34 34 weeks gestation of pregnancy: Secondary | ICD-10-CM | POA: Diagnosis not present

## 2023-08-17 DIAGNOSIS — E039 Hypothyroidism, unspecified: Secondary | ICD-10-CM

## 2023-08-17 DIAGNOSIS — F339 Major depressive disorder, recurrent, unspecified: Secondary | ICD-10-CM

## 2023-08-17 DIAGNOSIS — Z3403 Encounter for supervision of normal first pregnancy, third trimester: Secondary | ICD-10-CM

## 2023-08-17 NOTE — Patient Instructions (Signed)
 Things to Try After 37 weeks to Encourage Labor/Get Ready for Labor:    Try the [employer] at [email or web address].com/.com daily to improve baby's position and encourage the onset of labor.  Walk a little and rest a little every day.  Change positions often.  Cervical Ripening: May try one or both Red Raspberry Leaf capsules or tea:  two 300mg  or 400mg  tablets with each meal, 2-3 times a day, or 1-3 cups of tea daily  Potential Side Effects Of Raspberry Leaf:  Most women do not experience any side effects from drinking raspberry leaf tea. However, nausea and loose stools are possible.  Evening Primrose Oil capsules: take 1 capsule by mouth and place one capsule in the vagina every night.    Some of the potential side effects:  Upset stomach  Loose stools or diarrhea  Headaches  Nausea  Sex can also help the cervix ripen and encourage labor onset.  5. Eating 6-8 dates per day can help soften and even dilate your cervix. You may eat them plain, in smoothies, or in energy balls.  They do contain sugar so eat with caution and balance with protein.

## 2023-08-29 ENCOUNTER — Other Ambulatory Visit (HOSPITAL_COMMUNITY)
Admission: RE | Admit: 2023-08-29 | Discharge: 2023-08-29 | Disposition: A | Discharge status: Home / Self Care | Source: Ambulatory Visit | Attending: Obstetrics and Gynecology | Admitting: Obstetrics and Gynecology

## 2023-08-29 ENCOUNTER — Ambulatory Visit (INDEPENDENT_AMBULATORY_CARE_PROVIDER_SITE_OTHER): Admitting: Obstetrics and Gynecology

## 2023-08-29 VITALS — BP 114/80 | HR 105 | Wt 168.0 lb

## 2023-08-29 DIAGNOSIS — E039 Hypothyroidism, unspecified: Secondary | ICD-10-CM

## 2023-08-29 DIAGNOSIS — Z3A36 36 weeks gestation of pregnancy: Secondary | ICD-10-CM | POA: Insufficient documentation

## 2023-08-29 DIAGNOSIS — F339 Major depressive disorder, recurrent, unspecified: Secondary | ICD-10-CM

## 2023-08-29 DIAGNOSIS — Z1331 Encounter for screening for depression: Secondary | ICD-10-CM

## 2023-08-29 DIAGNOSIS — Z3403 Encounter for supervision of normal first pregnancy, third trimester: Secondary | ICD-10-CM

## 2023-08-29 DIAGNOSIS — Z3483 Encounter for supervision of other normal pregnancy, third trimester: Secondary | ICD-10-CM | POA: Diagnosis not present

## 2023-08-29 DIAGNOSIS — Z3493 Encounter for supervision of normal pregnancy, unspecified, third trimester: Secondary | ICD-10-CM | POA: Diagnosis not present

## 2023-08-29 NOTE — Progress Notes (Signed)
   PRENATAL VISIT NOTE  Subjective:  Tara Reyes is a 33 y.o. G1P0000 at [redacted]w[redacted]d being seen today for ongoing prenatal care.  She is currently monitored for the following issues for this low-risk pregnancy and has Depression, recurrent (HCC); Hypothyroid; and Supervision of normal first pregnancy on their problem list.  Patient reports no complaints.  Contractions: Not present. Vag. Bleeding: None.  Movement: Present. Denies leaking of fluid.   The following portions of the patient's history were reviewed and updated as appropriate: allergies, current medications, past family history, past medical history, past social history, past surgical history and problem list.   Objective:    Vitals:   08/29/23 0829  BP: 114/80  Pulse: (!) 105  Weight: 168 lb (76.2 kg)    Fetal Status:  Fetal Heart Rate (bpm): 135 Fundal Height: 36 cm Movement: Present Presentation: Vertex  General: Alert, oriented and cooperative. Patient is in no acute distress.  Skin: Skin is warm and dry. No rash noted.   Cardiovascular: Normal heart rate noted  Respiratory: Normal respiratory effort, no problems with respiration noted  Abdomen: Soft, gravid, appropriate for gestational age.  Pain/Pressure: Absent     Pelvic: Cervical exam performed in the presence of a chaperone Dilation: 3 Effacement (%): 70 Station: -1  Extremities: Normal range of motion.  Edema: Trace  Mental Status: Normal mood and affect. Normal behavior. Normal judgment and thought content.   Assessment and Plan:  Pregnancy: G1P0000 at [redacted]w[redacted]d 1. [redacted] weeks gestation of pregnancy (Primary)  2. Hypothyroidism, unspecified type Recheck TSH after delivery. Dose is stable by third trimester testing.   3. Depression, recurrent (HCC) Doing well on prozac . PHQ9/GAD7 wnl and she notes mostly its due to pregnancy physical symptoms.   4. Encounter for supervision of normal first pregnancy in third trimester Cultures done today.  PCN allergy - GBS  culture ordered as reflex.   Term labor symptoms and general obstetric precautions including but not limited to vaginal bleeding, contractions, leaking of fluid and fetal movement were reviewed in detail with the patient. Please refer to After Visit Summary for other counseling recommendations.   No follow-ups on file.  Future Appointments  Date Time Provider Department Center  09/05/2023  8:10 AM Lacey Pian, MD [facility]  09/12/2023  8:30 AM Izell Marsh, MD [facility]  09/19/2023  8:30 AM Izell Marsh, MD [facility]    Lacey Pian, MD

## 2023-08-30 LAB — CERVICOVAGINAL ANCILLARY ONLY
Chlamydia: NEGATIVE
Comment: NEGATIVE
Comment: NORMAL
Neisseria Gonorrhea: NEGATIVE

## 2023-09-02 LAB — STREP GP B CULTURE+RFLX: Strep Gp B Culture+Rflx: NEGATIVE

## 2023-09-04 NOTE — Progress Notes (Unsigned)
   PRENATAL VISIT NOTE  Subjective:  Tara Reyes is a 33 y.o. G1P0000 at [redacted]w[redacted]d being seen today for ongoing prenatal care.  She is currently monitored for the following issues for this {Blank single:19197::"high-risk","low-risk"} pregnancy and has Depression, recurrent (HCC); Hypothyroid; and Supervision of normal first pregnancy on their problem list.  Patient reports {sx:14538}.   .  .   . Denies leaking of fluid.   The following portions of the patient's history were reviewed and updated as appropriate: allergies, current medications, past family history, past medical history, past social history, past surgical history and problem list.   Objective:    There were no vitals filed for this visit.  Fetal Status:           General: Alert, oriented and cooperative. Patient is in no acute distress.  Skin: Skin is warm and dry. No rash noted.   Cardiovascular: Normal heart rate noted  Respiratory: Normal respiratory effort, no problems with respiration noted  Abdomen: Soft, gravid, appropriate for gestational age.        Pelvic: {Blank single:19197::"Cervical exam performed in the presence of a chaperone","Cervical exam deferred"}        Extremities: Normal range of motion.     Mental Status: Normal mood and affect. Normal behavior. Normal judgment and thought content.   Assessment and Plan:  Pregnancy: G1P0000 at [redacted]w[redacted]d 1. Hypothyroidism, unspecified type (Primary) Recheck TSH today  2. Encounter for supervision of normal first pregnancy in third trimester GBS neg  3. Pregnancy with 37 weeks completed gestation   Term labor symptoms and general obstetric precautions including but not limited to vaginal bleeding, contractions, leaking of fluid and fetal movement were reviewed in detail with the patient. Please refer to After Visit Summary for other counseling recommendations.   No follow-ups on file.  Future Appointments  Date Time Provider Department Center  09/05/2023   8:10 AM Lacey Pian, MD [facility]  09/12/2023  8:30 AM Izell Marsh, MD [facility]  09/19/2023  8:30 AM Izell Marsh, MD [facility]    Lacey Pian, MD

## 2023-09-05 ENCOUNTER — Ambulatory Visit (INDEPENDENT_AMBULATORY_CARE_PROVIDER_SITE_OTHER): Admitting: Obstetrics and Gynecology

## 2023-09-05 ENCOUNTER — Encounter: Payer: Self-pay | Admitting: Obstetrics and Gynecology

## 2023-09-05 VITALS — BP 113/80 | HR 96 | Wt 169.0 lb

## 2023-09-05 DIAGNOSIS — Z3A37 37 weeks gestation of pregnancy: Secondary | ICD-10-CM | POA: Diagnosis not present

## 2023-09-05 DIAGNOSIS — E039 Hypothyroidism, unspecified: Secondary | ICD-10-CM

## 2023-09-05 DIAGNOSIS — Z3403 Encounter for supervision of normal first pregnancy, third trimester: Secondary | ICD-10-CM

## 2023-09-06 ENCOUNTER — Other Ambulatory Visit (HOSPITAL_COMMUNITY): Payer: Self-pay

## 2023-09-06 ENCOUNTER — Ambulatory Visit: Payer: Self-pay | Admitting: Obstetrics and Gynecology

## 2023-09-06 DIAGNOSIS — E039 Hypothyroidism, unspecified: Secondary | ICD-10-CM

## 2023-09-06 LAB — TSH RFX ON ABNORMAL TO FREE T4: TSH: 4.72 u[IU]/mL — ABNORMAL HIGH (ref 0.450–4.500)

## 2023-09-06 LAB — T4F: T4,Free (Direct): 1 ng/dL (ref 0.82–1.77)

## 2023-09-06 MED ORDER — LEVOTHYROXINE SODIUM 100 MCG PO TABS
100.0000 ug | ORAL_TABLET | Freq: Every day | ORAL | 1 refills | Status: DC
  Filled 2023-09-06 – 2023-10-16 (×2): qty 30, 30d supply, fill #0
  Filled 2023-12-27 – 2024-01-17 (×2): qty 30, 30d supply, fill #1

## 2023-09-07 ENCOUNTER — Other Ambulatory Visit (HOSPITAL_COMMUNITY): Payer: Self-pay

## 2023-09-12 ENCOUNTER — Ambulatory Visit (INDEPENDENT_AMBULATORY_CARE_PROVIDER_SITE_OTHER): Admitting: Obstetrics and Gynecology

## 2023-09-12 VITALS — BP 116/85 | HR 96 | Wt 170.0 lb

## 2023-09-12 DIAGNOSIS — Z3A38 38 weeks gestation of pregnancy: Secondary | ICD-10-CM | POA: Diagnosis not present

## 2023-09-12 DIAGNOSIS — Z3403 Encounter for supervision of normal first pregnancy, third trimester: Secondary | ICD-10-CM | POA: Diagnosis not present

## 2023-09-12 DIAGNOSIS — F339 Major depressive disorder, recurrent, unspecified: Secondary | ICD-10-CM

## 2023-09-12 DIAGNOSIS — E039 Hypothyroidism, unspecified: Secondary | ICD-10-CM | POA: Diagnosis not present

## 2023-09-12 NOTE — Progress Notes (Signed)
   PRENATAL VISIT NOTE  Subjective:  Tara Reyes is a 33 y.o. G1P0000 at [redacted]w[redacted]d being seen today for ongoing prenatal care.  She is currently monitored for the following issues for this low-risk pregnancy and has Depression, recurrent (HCC); Hypothyroid; and Supervision of normal first pregnancy on their problem list.  Patient reports doing well overall - expected pregnancy symptoms like feeling hot, tired, etc. But doing OK.  Contractions: Not present. Vag. Bleeding: None.  Movement: Present. Denies leaking of fluid.   The following portions of the patient's history were reviewed and updated as appropriate: allergies, current medications, past family history, past medical history, past social history, past surgical history and problem list.   Objective:   Vitals:   09/12/23 0829  BP: 116/85  Pulse: 96  Weight: 170 lb (77.1 kg)    Fetal Status: Fetal Heart Rate (bpm): 156 Fundal Height: 39 cm Movement: Present     General:  Alert, oriented and cooperative. Patient is in no acute distress.  Skin: Skin is warm and dry. No rash noted.   Cardiovascular: Normal heart rate noted  Respiratory: Normal respiratory effort, no problems with respiration noted  Abdomen: Soft, gravid, appropriate for gestational age.  Pain/Pressure: Present     SCE performed in presence of chaperone  Assessment and Plan:  Pregnancy: G1P0000 at [redacted]w[redacted]d 1. Encounter for supervision of normal first pregnancy in third trimester (Primary) 2. [redacted] weeks gestation of pregnancy GBS neg Discussed potential membrane sweep next visit Discussed & ordered pdIOL for 7/7  3. Hypothyroidism, unspecified type TSH 4.72 on 6/11, dose increased to 100mcg daily  4. Depression, recurrent (HCC) Doing well on prozac   Term labor symptoms and general obstetric precautions including but not limited to vaginal bleeding, contractions, leaking of fluid and fetal movement were reviewed in detail with the patient. Please refer to  After Visit Summary for other counseling recommendations.   Return in about 1 week (around 09/19/2023) for return OB at 39 weeks.  Future Appointments  Date Time Provider Department Center  09/19/2023  8:30 AM Izell Marsh, MD [facility]   Izell Marsh, MD

## 2023-09-17 ENCOUNTER — Other Ambulatory Visit (HOSPITAL_COMMUNITY): Payer: Self-pay

## 2023-09-19 ENCOUNTER — Other Ambulatory Visit: Payer: Self-pay

## 2023-09-19 ENCOUNTER — Inpatient Hospital Stay (HOSPITAL_COMMUNITY)
Admission: AD | Admit: 2023-09-19 | Discharge: 2023-09-22 | DRG: 787 | Disposition: A | Discharge status: Home / Self Care | Attending: Obstetrics and Gynecology | Admitting: Obstetrics and Gynecology

## 2023-09-19 ENCOUNTER — Ambulatory Visit: Admitting: Obstetrics and Gynecology

## 2023-09-19 VITALS — BP 110/78 | HR 87 | Wt 168.0 lb

## 2023-09-19 DIAGNOSIS — Z8249 Family history of ischemic heart disease and other diseases of the circulatory system: Secondary | ICD-10-CM

## 2023-09-19 DIAGNOSIS — Z833 Family history of diabetes mellitus: Secondary | ICD-10-CM

## 2023-09-19 DIAGNOSIS — E039 Hypothyroidism, unspecified: Secondary | ICD-10-CM | POA: Diagnosis not present

## 2023-09-19 DIAGNOSIS — Z880 Allergy status to penicillin: Secondary | ICD-10-CM | POA: Diagnosis not present

## 2023-09-19 DIAGNOSIS — Z3403 Encounter for supervision of normal first pregnancy, third trimester: Secondary | ICD-10-CM

## 2023-09-19 DIAGNOSIS — Z3A39 39 weeks gestation of pregnancy: Secondary | ICD-10-CM

## 2023-09-19 DIAGNOSIS — D62 Acute posthemorrhagic anemia: Secondary | ICD-10-CM | POA: Diagnosis not present

## 2023-09-19 DIAGNOSIS — F339 Major depressive disorder, recurrent, unspecified: Secondary | ICD-10-CM

## 2023-09-19 DIAGNOSIS — O99344 Other mental disorders complicating childbirth: Secondary | ICD-10-CM | POA: Diagnosis present

## 2023-09-19 DIAGNOSIS — Z79890 Hormone replacement therapy (postmenopausal): Secondary | ICD-10-CM

## 2023-09-19 DIAGNOSIS — O26893 Other specified pregnancy related conditions, third trimester: Secondary | ICD-10-CM | POA: Diagnosis not present

## 2023-09-19 DIAGNOSIS — O99284 Endocrine, nutritional and metabolic diseases complicating childbirth: Secondary | ICD-10-CM | POA: Diagnosis not present

## 2023-09-19 DIAGNOSIS — Z3490 Encounter for supervision of normal pregnancy, unspecified, unspecified trimester: Principal | ICD-10-CM | POA: Diagnosis present

## 2023-09-19 DIAGNOSIS — O324XX0 Maternal care for high head at term, not applicable or unspecified: Secondary | ICD-10-CM | POA: Diagnosis not present

## 2023-09-19 DIAGNOSIS — Z79899 Other long term (current) drug therapy: Secondary | ICD-10-CM | POA: Diagnosis not present

## 2023-09-19 DIAGNOSIS — O9081 Anemia of the puerperium: Secondary | ICD-10-CM | POA: Diagnosis not present

## 2023-09-19 DIAGNOSIS — Z3A35 35 weeks gestation of pregnancy: Secondary | ICD-10-CM | POA: Diagnosis not present

## 2023-09-19 LAB — TYPE AND SCREEN
ABO/RH(D): AB POS
Antibody Screen: NEGATIVE

## 2023-09-19 LAB — CBC
HCT: 37.6 % (ref 36.0–46.0)
Hemoglobin: 12.6 g/dL (ref 12.0–15.0)
MCH: 27.6 pg (ref 26.0–34.0)
MCHC: 33.5 g/dL (ref 30.0–36.0)
MCV: 82.3 fL (ref 80.0–100.0)
Platelets: 260 10*3/uL (ref 150–400)
RBC: 4.57 MIL/uL (ref 3.87–5.11)
RDW: 15.9 % — ABNORMAL HIGH (ref 11.5–15.5)
WBC: 15.8 10*3/uL — ABNORMAL HIGH (ref 4.0–10.5)
nRBC: 0 % (ref 0.0–0.2)

## 2023-09-19 MED ORDER — TERBUTALINE SULFATE 1 MG/ML IJ SOLN: 0.2500 mg | Freq: Once | INTRAMUSCULAR | Status: DC | PRN

## 2023-09-19 MED ORDER — SODIUM CHLORIDE 0.9% FLUSH: 3.0000 mL | Freq: Two times a day (BID) | INTRAVENOUS | Status: DC

## 2023-09-19 MED ORDER — FLUOXETINE HCL 20 MG PO CAPS
40.0000 mg | ORAL_CAPSULE | Freq: Every day | ORAL | Status: DC
  Administered 2023-09-21 – 2023-09-22 (×2): 40 mg via ORAL
  Filled 2023-09-19 (×3): qty 2

## 2023-09-19 MED ORDER — SODIUM CHLORIDE 0.9% FLUSH: 3.0000 mL | INTRAVENOUS | Status: DC | PRN

## 2023-09-19 MED ORDER — LEVOTHYROXINE SODIUM 100 MCG PO TABS
100.0000 ug | ORAL_TABLET | Freq: Every day | ORAL | Status: DC
  Administered 2023-09-20 – 2023-09-22 (×3): 100 ug via ORAL
  Filled 2023-09-19 (×4): qty 1

## 2023-09-19 MED ORDER — OXYTOCIN-SODIUM CHLORIDE 30-0.9 UT/500ML-% IV SOLN: 2.5000 [IU]/h | INTRAVENOUS | Status: DC

## 2023-09-19 MED ORDER — LACTATED RINGERS IV SOLN
500.0000 mL | INTRAVENOUS | Status: DC | PRN
  Administered 2023-09-20: 500 mL via INTRAVENOUS

## 2023-09-19 MED ORDER — SODIUM CHLORIDE 0.9 % IV SOLN: 250.0000 mL | INTRAVENOUS | Status: AC | PRN

## 2023-09-19 MED ORDER — SOD CITRATE-CITRIC ACID 500-334 MG/5ML PO SOLN
30.0000 mL | ORAL | Status: DC | PRN
  Administered 2023-09-20: 30 mL via ORAL
  Filled 2023-09-19: qty 30

## 2023-09-19 MED ORDER — FLUOXETINE HCL 20 MG PO CAPS: 40.0000 mg | ORAL_CAPSULE | Freq: Every day | ORAL | Status: DC

## 2023-09-19 MED ORDER — LIDOCAINE HCL (PF) 1 % IJ SOLN: 30.0000 mL | INTRAMUSCULAR | Status: DC | PRN

## 2023-09-19 MED ORDER — ONDANSETRON HCL 4 MG/2ML IJ SOLN
4.0000 mg | Freq: Four times a day (QID) | INTRAMUSCULAR | Status: DC | PRN
  Administered 2023-09-19: 4 mg via INTRAVENOUS
  Filled 2023-09-19: qty 2

## 2023-09-19 MED ORDER — OXYTOCIN-SODIUM CHLORIDE 30-0.9 UT/500ML-% IV SOLN
1.0000 m[IU]/min | INTRAVENOUS | Status: DC
  Administered 2023-09-20: 2 m[IU]/min via INTRAVENOUS
  Filled 2023-09-19: qty 500

## 2023-09-19 MED ORDER — FENTANYL CITRATE (PF) 100 MCG/2ML IJ SOLN
50.0000 ug | INTRAMUSCULAR | Status: DC | PRN
  Administered 2023-09-20 (×2): 100 ug via INTRAVENOUS
  Filled 2023-09-19 (×2): qty 2

## 2023-09-19 MED ORDER — ACETAMINOPHEN 325 MG PO TABS: 650.0000 mg | ORAL_TABLET | ORAL | Status: DC | PRN

## 2023-09-19 MED ORDER — OXYTOCIN BOLUS FROM INFUSION: 333.0000 mL | Freq: Once | INTRAVENOUS | Status: DC

## 2023-09-19 NOTE — H&P (Signed)
 Tara Reyes is a 32 y.o. G1P0000 female at [redacted]w[redacted]d by LMP c/w [redacted]w[redacted]d u/s presenting for reg ctx.   Reports active fetal movement, contractions: regular, every 3-4 minutes, vaginal bleeding: spotting, membranes: intact.  Initiated prenatal care at [facility] then [facility] at 12.3 wks.   Most recent u/s: [redacted]w[redacted]d, EFW 56%, post placenta, nl anatomy.   This pregnancy complicated by: # depression (Prozac  40mg ; therapist) # hypothyroid (Synthroid  )  Prenatal History/Complications:  # primigravida  Past Medical History: Past Medical History:  Diagnosis Date   Depression    Hypothyroid     Past Surgical History: Past Surgical History:  Procedure Laterality Date   WISDOM TOOTH EXTRACTION      Obstetrical History: OB History     Gravida  1   Para  0   Term  0   Preterm  0   AB  0   Living         SAB  0   IAB  0   Ectopic  0   Multiple      Live Births              Social History: Social History   Socioeconomic History   Marital status: Married    Spouse name: Not on file   Number of children: Not on file   Years of education: Not on file   Highest education level: Master's degree (e.g., MA, MS, MEng, MEd, MSW, MBA)  Occupational History   Not on file  Tobacco Use   Smoking status: Never   Smokeless tobacco: Never  Vaping Use   Vaping status: Never Used  Substance and Sexual Activity   Alcohol use: Not Currently    Comment: social   Drug use: No   Sexual activity: Yes    Birth control/protection: None  Other Topics Concern   Not on file  Social History Narrative   ** Merged History Encounter **       Social Drivers of Health   Financial Resource Strain: Low Risk  (11/04/2022)   Overall Financial Resource Strain (CARDIA)    Difficulty of Paying Living Expenses: Not hard at all  Food Insecurity: No Food Insecurity (11/04/2022)   Hunger Vital Sign    Worried About Running Out of Food in the Last Year: Never true    Ran Out of Food in the Last  Year: Never true  Transportation Needs: No Transportation Needs (11/04/2022)   PRAPARE - Administrator, Civil Service (Medical): No    Lack of Transportation (Non-Medical): No  Physical Activity: Sufficiently Active (11/04/2022)   Exercise Vital Sign    Days of Exercise per Week: 4 days    Minutes of Exercise per Session: 150+ min  Stress: No Stress Concern Present (11/04/2022)   [employer] of Occupational Health - Occupational Stress Questionnaire    Feeling of Stress : Only a little  Social Connections: Socially Integrated (11/04/2022)   Social Connection and Isolation Panel    Frequency of Communication with Friends and Family: Three times a week    Frequency of Social Gatherings with Friends and Family: More than three times a week    Attends Religious Services: More than 4 times per year    Active Member of [employer] or Organizations: Yes    Attends Engineer, structural: More than 4 times per year    Marital Status: Married    Family History: Family History  Problem Relation Age of Onset   Hypertension  Father    Gout Father    Diabetes Maternal Grandmother    Arthritis Maternal Grandmother    Heart attack Maternal Grandfather    Hypertension Paternal Grandfather    Deep vein thrombosis Paternal Grandfather     Allergies: Allergies  Allergen Reactions   Other     Vicryl Suture   Penicillins Hives    Medications Prior to Admission  Medication Sig Dispense Refill Last Dose/Taking   FLUoxetine  (PROZAC ) 40 MG capsule Take 1 capsule (40 mg total) by mouth daily. 90 capsule 3    levothyroxine  (SYNTHROID ) 100 MCG tablet Take 1 tablet (100 mcg total) by mouth daily before breakfast. 30 tablet 1    Prenatal Vit-Fe Fumarate-FA (PRENATAL VITAMIN PO) Take by mouth.       Review of Systems  Pertinent pos/neg as indicated in HPI  Blood pressure 122/80, pulse 88, temperature 98.6 F (37 C), temperature source Oral, resp. rate 20, height 5' 2 (1.575 m),  weight 77.7 kg, last menstrual period 12/16/2022, SpO2 100%. General appearance: alert, cooperative, and no distress Lungs: clear to auscultation bilaterally Heart: regular rate and rhythm Abdomen: gravid, soft, non-tender, EFW by Leopold's approximately 7lbs Extremities: tr edema  Fetal monitoring: FHR: 140s bpm, variability: moderate,  Accelerations: Present,  decelerations:  Absent Uterine activity: Frequency: Every 3-4 minutes Dilation: 6 Effacement (%): 90 Station: 0 Exam by:: [email or web address] Presentation: cephalic   Prenatal labs: ABO, Rh: AB/Positive/-- (12/17 1500) Antibody: Negative (12/17 1500) Rubella: 1.54 (12/17 1500) RPR: Non Reactive (04/09 0845)  HBsAg: Negative (12/17 1500)  HIV: Non Reactive (04/09 0845)  GBS: Negative/-- (06/04 0846)  2hr GTT: 80/130/87  Prenatal Transfer Tool  Maternal Diabetes: No Genetic Screening: Normal Maternal Ultrasounds/Referrals: Normal Fetal Ultrasounds or other Referrals:  None Maternal Substance Abuse:  No Significant Maternal Medications:  Meds include: Prozac  Syntroid Significant Maternal Lab Results: Group B Strep negative  No results found for this or any previous visit (from the past 24 hours).   Assessment:  [redacted]w[redacted]d SIUP  G1P0000  Active labor  Cat 1 FHR  GBS Negative/-- (06/04 0846)  Plan:  Admit to L&D  Plans hydrotherapy/waterbirth  Expectant management w intermittent monitoring  Anticipate vag delivery   Plans to breastfeed  Contraception: Mirena  outpt  Circumcision: yes, if boy  Suzen JONETTA Gentry CNM 09/19/2023, 9:31 PM

## 2023-09-19 NOTE — Progress Notes (Signed)
   PRENATAL VISIT NOTE  Subjective:  Tara Reyes is a 33 y.o. G1P0000 at [redacted]w[redacted]d being seen today for ongoing prenatal care.  She is currently monitored for the following issues for this low-risk pregnancy and has Depression, recurrent (HCC); Hypothyroid; and Supervision of normal first pregnancy on their problem list.  Patient reports no complaints.  Contractions: Irritability. Vag. Bleeding: None.  Movement: Present. Denies leaking of fluid.   The following portions of the patient's history were reviewed and updated as appropriate: allergies, current medications, past family history, past medical history, past social history, past surgical history and problem list.   Objective:   Vitals:   09/19/23 0837  BP: 110/78  Pulse: 87  Weight: 168 lb (76.2 kg)    Fetal Status: Fetal Heart Rate (bpm): 148 Fundal Height: 40 cm Movement: Present     General:  Alert, oriented and cooperative. Patient is in no acute distress.  Skin: Skin is warm and dry. No rash noted.   Cardiovascular: Normal heart rate noted  Respiratory: Normal respiratory effort, no problems with respiration noted  Abdomen: Soft, gravid, appropriate for gestational age.  Pain/Pressure: Present      Assessment and Plan:  Pregnancy: G1P0000 at [redacted]w[redacted]d 1. Encounter for supervision of normal first pregnancy in third trimester (Primary) 2. [redacted] weeks gestation of pregnancy pdIOL scheduled 7/7 Discussed membrane sweep, pt accepts. Performed in presence of chaperone. Counseled to expect more cramping & spotting, reviewed MAU precautions  3. Hypothyroidism, unspecified type TSH 4.72 on 6/11, dose increased to 100mcg daily   4. Depression, recurrent (HCC) Doing well on prozac    Please refer to After Visit Summary for other counseling recommendations.   Return in about 1 week (around 09/26/2023) for return OB at 40 weeks with NST.  Future Appointments  Date Time Provider Department Center  10/01/2023  7:15 AM [facility] SCHED ROOM  [facility] None   Kieth JAYSON Carolin, MD

## 2023-09-19 NOTE — MAU Note (Signed)
 Tara Reyes is a 33 y.o. at [redacted]w[redacted]d here in MAU reporting: ctx since 1100 but more regular since 1730 - every 3-4 minutes. Had membrane sweep this morning - reports pink spotting but denies VB or LOF. +FM   LMP: NA Onset of complaint: 1730 Pain score: 7 Vitals:   09/19/23 2054  BP: 122/80  Pulse: 88  Resp: 20  Temp: 98.6 F (37 C)  SpO2: 100%     FHT: 135  Lab orders placed from triage: labor eval

## 2023-09-20 ENCOUNTER — Inpatient Hospital Stay (HOSPITAL_COMMUNITY): Admitting: Anesthesiology

## 2023-09-20 ENCOUNTER — Encounter (HOSPITAL_COMMUNITY): Payer: Self-pay | Admitting: Obstetrics and Gynecology

## 2023-09-20 ENCOUNTER — Encounter (HOSPITAL_COMMUNITY)
Admission: AD | Disposition: A | Discharge status: Home / Self Care | Payer: Self-pay | Source: Home / Self Care | Attending: Obstetrics and Gynecology

## 2023-09-20 DIAGNOSIS — Z3A39 39 weeks gestation of pregnancy: Secondary | ICD-10-CM

## 2023-09-20 LAB — RPR: RPR Ser Ql: NONREACTIVE

## 2023-09-20 SURGERY — Surgical Case
Anesthesia: Regional | Site: Abdomen

## 2023-09-20 MED ORDER — PHENYLEPHRINE 80 MCG/ML (10ML) SYRINGE FOR IV PUSH (FOR BLOOD PRESSURE SUPPORT): 80.0000 ug | PREFILLED_SYRINGE | INTRAVENOUS | Status: DC | PRN

## 2023-09-20 MED ORDER — LACTATED RINGERS IV SOLN: INTRAVENOUS | Status: AC

## 2023-09-20 MED ORDER — EPHEDRINE 5 MG/ML INJ: 10.0000 mg | INTRAVENOUS | Status: DC | PRN

## 2023-09-20 MED ORDER — MORPHINE SULFATE (PF) 0.5 MG/ML IJ SOLN
INTRAMUSCULAR | Status: AC
  Filled 2023-09-20: qty 10

## 2023-09-20 MED ORDER — FENTANYL-BUPIVACAINE-NACL 0.5-0.125-0.9 MG/250ML-% EP SOLN
12.0000 mL/h | EPIDURAL | Status: DC | PRN
  Administered 2023-09-20: 12 mL/h via EPIDURAL
  Filled 2023-09-20: qty 250

## 2023-09-20 MED ORDER — DIPHENHYDRAMINE HCL 50 MG/ML IJ SOLN
12.5000 mg | INTRAMUSCULAR | Status: DC | PRN
  Filled 2023-09-20: qty 1

## 2023-09-20 MED ORDER — LIDOCAINE-EPINEPHRINE (PF) 2 %-1:200000 IJ SOLN
INTRAMUSCULAR | Status: DC | PRN
  Administered 2023-09-20 (×2): 3 mL via EPIDURAL
  Administered 2023-09-20 (×2): 5 mL via EPIDURAL

## 2023-09-20 MED ORDER — SODIUM CHLORIDE 0.9 % IR SOLN
Status: DC | PRN
  Administered 2023-09-20: 1

## 2023-09-20 MED ORDER — BUPIVACAINE HCL (PF) 0.25 % IJ SOLN
INTRAMUSCULAR | Status: DC | PRN
Start: 2023-09-20 — End: 2023-09-21
  Administered 2023-09-20: 1.6 mL via EPIDURAL

## 2023-09-20 MED ORDER — STERILE WATER FOR IRRIGATION IR SOLN
Status: DC | PRN
Start: 2023-09-20 — End: 2023-09-21
  Administered 2023-09-20: 1000 mL

## 2023-09-20 MED ORDER — DIPHENHYDRAMINE HCL 50 MG/ML IJ SOLN
50.0000 mg | Freq: Once | INTRAMUSCULAR | Status: AC
  Administered 2023-09-20: 50 mg via INTRAVENOUS

## 2023-09-20 MED ORDER — FENTANYL CITRATE (PF) 100 MCG/2ML IJ SOLN
INTRAMUSCULAR | Status: DC | PRN
  Administered 2023-09-20: 100 ug via EPIDURAL

## 2023-09-20 MED ORDER — LACTATED RINGERS IV SOLN: 500.0000 mL | Freq: Once | INTRAVENOUS | Status: AC

## 2023-09-20 MED ORDER — ONDANSETRON HCL 4 MG/2ML IJ SOLN
INTRAMUSCULAR | Status: DC | PRN
  Administered 2023-09-20: 4 mg via INTRAVENOUS

## 2023-09-20 MED ORDER — OXYTOCIN-SODIUM CHLORIDE 30-0.9 UT/500ML-% IV SOLN
INTRAVENOUS | Status: DC | PRN
  Administered 2023-09-20: 30 [IU] via INTRAVENOUS

## 2023-09-20 MED ORDER — FENTANYL CITRATE (PF) 100 MCG/2ML IJ SOLN
INTRAMUSCULAR | Status: AC
  Filled 2023-09-20: qty 2

## 2023-09-20 SURGICAL SUPPLY — 16 items
CHLORAPREP W/TINT 26 (MISCELLANEOUS) ×2 IMPLANT
DRSG OPSITE POSTOP 4X10 (GAUZE/BANDAGES/DRESSINGS) ×1 IMPLANT
ELECTRODE REM PT RTRN 9FT ADLT (ELECTROSURGICAL) ×1 IMPLANT
GLOVE BIOGEL PI IND STRL 6.5 (GLOVE) ×1 IMPLANT
GLOVE BIOGEL PI IND STRL 7.0 (GLOVE) ×1 IMPLANT
GLOVE SURG SS PI 6.5 STRL IVOR (GLOVE) ×1 IMPLANT
GOWN STRL REUS W/TWL LRG LVL3 (GOWN DISPOSABLE) ×2 IMPLANT
NS IRRIG 1000ML POUR BTL (IV SOLUTION) ×1 IMPLANT
PACK C SECTION WH (CUSTOM PROCEDURE TRAY) ×1 IMPLANT
PAD OB MATERNITY 4.3X12.25 (PERSONAL CARE ITEMS) ×1 IMPLANT
RTRCTR C-SECT PINK 25CM LRG (MISCELLANEOUS) IMPLANT
STRIP CLOSURE SKIN 1/2X4 (GAUZE/BANDAGES/DRESSINGS) IMPLANT
SUT VIC AB 0 CT1 36 (SUTURE) ×4 IMPLANT
SUT VIC AB 2-0 CT1 TAPERPNT 27 (SUTURE) IMPLANT
TOWEL OR 17X24 6PK STRL BLUE (TOWEL DISPOSABLE) ×1 IMPLANT
WATER STERILE IRR 1000ML POUR (IV SOLUTION) ×1 IMPLANT

## 2023-09-20 NOTE — Progress Notes (Signed)
 Patient ID: Tara Reyes, female   DOB: [birth year], 32 y.o.   MRN: [id number]  In tub again, feeling more pressure; FHR 140s; Ctx q 3 mins Await urge to push  Suzen JONETTA Gentry CNM 09/20/2023 5:00 AM

## 2023-09-20 NOTE — Progress Notes (Signed)
 Patient ID: Tara Reyes, female   DOB: 01/29/1991, 33 y.o.   MRN: [id number]  In tub and coping well w ctx; had some N/V earlier and rec'd Zofran  which has been helpful; has saline lock and has been taking in mod amts of apple juice and ice  VSS, afebrile FHR dopplered per intermittent ausc, 140s w +accels Ctx q 2-3 mins Cx deferred  IUP@39 .5wks Active labor  Continue w expectant management Check cx next depending on symptom changes Anticipate vag delivery  Suzen JONETTA Gentry [facility] 09/20/2023 12:27 AM

## 2023-09-20 NOTE — Progress Notes (Signed)
 Called by CNM due to suspected OP  Confirmed at bedside LOP, facing materal right.  Discussed manual rotation. Reviewed this can help with position to move towards vaginal birth  120/moderate/+accels no decels  Both during contraction and outside of contractions I performed a manual rotation of the fetal head Rotated to LOA position with  gentle rotational movement- able to palpat previous caput on left lower parietal. Suspect infant also asynclitic.  Excellent maternal pushing efforts.  Continued to have anterior lip but this is reducible   Anticipate vaginal delivery CNM team Lenis Gentry and SCNM Vernell Ruddle) remain at bedside  Suzen Maryan Masters, MD, MPH, ABFM, [facility] Attending Physician Center for [facility]

## 2023-09-20 NOTE — Progress Notes (Signed)
 Late entry At about 2pm recheck was unchanged. Patient fairly exhausted. Reviewed option for epidural. Patient accepted and we turned off pitocin while awaiting placement and efficacy of epidural. Will allow pit break and rest once epidural in place. After rest plan recheck with likely restart of pit  Prior to turning off Pitocin was at 14mu

## 2023-09-20 NOTE — Progress Notes (Signed)
 Patient ID: Tara Reyes, female   DOB: [birth year], 33 y.o.   MRN: [id number]  Wanting to get out of tub to have an exam  VSS, afeb FHR 130s, +accels, no decels Ctx q 3 mins Cx 9/C/vtx 0, +1; AROM for clear fluid (must have only been amnion that was ruptured at 0230)  IUP@39 .5wks Transition  Is going to stay in bed for a bit now and try nitrous Anticipate vag del  Epic Tribbett D Serenitee Fuertes CNM 09/20/2023 6:38 AM

## 2023-09-20 NOTE — Progress Notes (Signed)
 Labor progress note:  Dr. Alger at bedside to discuss plan of care w pt -- cx unchanged since 0600, had progressed to a reducible anterior lip and had pushed for three hours earlier in the day with no descent. Attempt also made to rotate from LOP to LOA, but fetus appears to have rotated back to OP position per RN last check and is asynclitic. Cat I tracing overall. Dr. Alger discussed that she would meet criteria for arrest of descent at this juncture, discussed option to proceed with cesarean delivery vs attempt pushing for a bit longer. Patient would like to consider options and will let us  know.  The risks of surgery were discussed with the patient including but were not limited to: bleeding which may require transfusion or reoperation; infection which may require antibiotics; injury to bowel, bladder, ureters or other surrounding organs; injury to the fetus; need for additional procedures including hysterectomy in the event of a life-threatening hemorrhage; formation of adhesions; placental abnormalities wth subsequent pregnancies; incisional problems; thromboembolic phenomenon and other postoperative/anesthesia complications.    Alain Sor, MD OB Fellow, Faculty Practice [facility], Center for [facility]

## 2023-09-20 NOTE — Plan of Care (Signed)

## 2023-09-20 NOTE — Anesthesia Procedure Notes (Signed)
 Epidural Patient location during procedure: OB Start time: 09/20/2023 2:00 PM End time: 09/20/2023 2:10 PM  Staffing Anesthesiologist: Niels Marien CROME, MD Performed: anesthesiologist   Preanesthetic Checklist Completed: patient identified, IV checked, risks and benefits discussed, monitors and equipment checked, pre-op evaluation and timeout performed  Epidural Patient position: sitting Prep: DuraPrep and site prepped and draped Patient monitoring: continuous pulse ox, blood pressure, heart rate and cardiac monitor Approach: midline Location: L3-L4 Injection technique: LOR air  Needle:  Needle type: Tuohy  Needle gauge: 17 G Needle length: 9 cm Needle insertion depth: 5 cm Catheter type: closed end flexible Catheter size: 19 Gauge Catheter at skin depth: 10 cm Test dose: negative  Assessment Sensory level: T8 Events: blood not aspirated, no cerebrospinal fluid, injection not painful, no injection resistance, no paresthesia and negative IV test  Additional Notes Patient identified. Risks/Benefits/Options discussed with patient including but not limited to bleeding, infection, nerve damage, paralysis, failed block, incomplete pain control, headache, blood pressure changes, nausea, vomiting, reactions to medication both or allergic, itching and postpartum back pain. Confirmed with bedside nurse the patient's most recent platelet count. Confirmed with patient that they are not currently taking any anticoagulation, have any bleeding history or any family history of bleeding disorders. Patient expressed understanding and wished to proceed. All questions were answered. Sterile technique was used throughout the entire procedure. Please see nursing notes for vital signs. Test dose was given through epidural catheter and negative prior to continuing to dose epidural or start infusion. Warning signs of high block given to the patient including shortness of breath, tingling/numbness in hands,  complete motor block, or any concerning symptoms with instructions to call for help. Patient was given instructions on fall risk and not to get out of bed. All questions and concerns addressed with instructions to call with any issues or inadequate analgesia.     CSE performed without complication. LOR at 5cm. 25G pencan inserted into intrathecal space with return of CSF. Marcaine administered intrathecally as per chart. Epidural catheter inserted into epidural space and left at 10cm at skin. Pt tolerated well. VSS.  Reason for block:procedure for pain

## 2023-09-20 NOTE — Progress Notes (Signed)
 Patient ID: Tara Reyes, female   DOB: [birth year], 33 y.o.   MRN: [id number]  Vitals:   09/20/23 1655 09/20/23 1700  BP:  118/84  Pulse:  85  Resp:  16  Temp:    SpO2: 99% 99%   Dilation: Lip/rim Dilation Complete Date: 09/20/23 Dilation Complete Time: 1044 Effacement (%): 100 Cervical Position: Middle Station: Plus 2 Presentation: Vertex Exam by:: Vernell Ruddle SNM  Has been able to sleep following epidural placement. Feeling rectal pressure. FHR Cat 1: baseline 120, moderate variability, accels present, occasional early decels. UC q 5-6 minutes. SVE anterior cervical lip still present, but no longer edematous. Plan to restart Pitocin and titrate as tolerated.  Vernell Ruddle, SNM 09/20/23

## 2023-09-20 NOTE — Progress Notes (Signed)
 Labor progress note:  Patient has been pushing for 1.5 hours at this point, has had minimal descent with pushing. We discussed options to proceed with pushing vs a cesarean delivery. Patient acknowledged that she feels tired, unsure if she would be able to continue pushing with good effort for 1-2 more hours. At this point, patient and her partner have discussed options amongst themselves and would like to proceed with a cesarean delivery.  Tara Reyes has agreed to proceed with cesarean section due to Arrest of Descent. The risks of surgery were discussed with the patient including but were not limited to: bleeding which may require transfusion or reoperation; infection which may require antibiotics; injury to bowel, bladder, ureters or other surrounding organs; injury to the fetus; need for additional procedures including hysterectomy in the event of a life-threatening hemorrhage; formation of adhesions; placental abnormalities wth subsequent pregnancies; incisional problems; thromboembolic phenomenon and other postoperative/anesthesia complications. The patient concurred with the proposed plan, giving informed written consent for the procedure. Patient has had a clear liquid diet during her labor course, will remain NPO for procedure. Anesthesia and OR aware. Preoperative prophylactic antibiotics and SCDs ordered on call to the OR. To OR when ready.    Alain Sor, MD OB Fellow, Faculty Practice [facility], Center for [facility]

## 2023-09-20 NOTE — Progress Notes (Addendum)
 Patient ID: Tara Reyes, female   DOB: 03-Mar-1991, 33 y.o.   MRN: [id number]  S/p more tub, some nitrous with side-lying release and Walcher's; exam at [phone] showed still 9 (less ant, more mat post left)/C/+1; FHR reactive and Cat 1; ctx q 3 mins; offered Pit. She got a dose of Fentanyl and rested in-between a bit; is now getting Pitocin started. Due to protracted active/transition, we are trying to boost the strength/frequency of ctx; doesn't feel asynclitic or OP on exam.  Anticipate vag delivery.  Suzen JONETTA Gentry CNM 09/20/2023 9:34 AM

## 2023-09-20 NOTE — Progress Notes (Signed)
 Patient ID: NIDYA BOUYER, female   DOB: June 10, 1990, 33 y.o.   MRN: [id number]  Has been in and out of tub and to and from bathroom to empty bladder  VSS, afebrile FHR 130-140s, +accels, no decels Ctx q 2-5 mins Cx 8/C/0; AROM w exam for clear fluid  IUP@39 .5wks Active labor/transition  Will rest on side in bed for now Anticipate vag delivery  Suzen JONETTA Gentry [facility] 09/20/2023

## 2023-09-20 NOTE — Progress Notes (Addendum)
 Labor progress note:  Patient desired to proceed with pushing. Pushing with excellent effort. Ctx every 2-3 min, pitocin increased to 14 mU/min. Attempted closed knees pushing, tug-of-war thus far. Moving baby some with every ctx. Cat I tracing throughout tracing.   We discussed goal of getting patient to +2 station, which would allow a vacuum assisted delivery to be an option. Risks, benefits, alternatives of a vacuum assisted delivery were reviewed with patient and her partner and they are open to VAVD if able to be safely attempted.   Alain Sor, MD OB Fellow, Faculty Practice [facility], Center for [facility]

## 2023-09-20 NOTE — Anesthesia Preprocedure Evaluation (Signed)
 Anesthesia Evaluation  Patient identified by MRN, date of birth, ID band Patient awake    Reviewed: Allergy & Precautions, NPO status , Patient's Chart, lab work & pertinent test results  Airway Mallampati: II  TM Distance: >3 FB Neck ROM: Full    Dental no notable dental hx.    Pulmonary neg pulmonary ROS   Pulmonary exam normal breath sounds clear to auscultation       Cardiovascular negative cardio ROS Normal cardiovascular exam Rhythm:Regular Rate:Normal     Neuro/Psych  PSYCHIATRIC DISORDERS  Depression    negative neurological ROS     GI/Hepatic negative GI ROS, Neg liver ROS,,,  Endo/Other  Hypothyroidism    Renal/GU negative Renal ROS  negative genitourinary   Musculoskeletal negative musculoskeletal ROS (+)    Abdominal   Peds  Hematology negative hematology ROS (+)   Anesthesia Other Findings   Reproductive/Obstetrics (+) Pregnancy                             Anesthesia Physical Anesthesia Plan  ASA: 2  Anesthesia Plan: Combined Spinal and Epidural   Post-op Pain Management:    Induction:   PONV Risk Score and Plan: Treatment may vary due to age or medical condition  Airway Management Planned: Natural Airway  Additional Equipment:   Intra-op Plan:   Post-operative Plan:   Informed Consent: I have reviewed the patients History and Physical, chart, labs and discussed the procedure including the risks, benefits and alternatives for the proposed anesthesia with the patient or authorized representative who has indicated his/her understanding and acceptance.       Plan Discussed with: Anesthesiologist  Anesthesia Plan Comments: (Patient identified. Risks, benefits, options discussed with patient including but not limited to bleeding, infection, nerve damage, paralysis, failed block, incomplete pain control, headache, blood pressure changes, nausea, vomiting,  reactions to medication, itching, and post partum back pain. Confirmed with bedside nurse the patient's most recent platelet count. Confirmed with the patient that they are not taking any anticoagulation, have any bleeding history or any family history of bleeding disorders. Patient expressed understanding and wishes to proceed. All questions were answered. )       Anesthesia Quick Evaluation

## 2023-09-21 ENCOUNTER — Other Ambulatory Visit: Payer: Self-pay

## 2023-09-21 LAB — CBC
HCT: 26.9 % — ABNORMAL LOW (ref 36.0–46.0)
Hemoglobin: 8.5 g/dL — ABNORMAL LOW (ref 12.0–15.0)
MCH: 26.6 pg (ref 26.0–34.0)
MCHC: 31.6 g/dL (ref 30.0–36.0)
MCV: 84.1 fL (ref 80.0–100.0)
Platelets: 167 10*3/uL (ref 150–400)
RBC: 3.2 MIL/uL — ABNORMAL LOW (ref 3.87–5.11)
RDW: 16 % — ABNORMAL HIGH (ref 11.5–15.5)
WBC: 19.3 10*3/uL — ABNORMAL HIGH (ref 4.0–10.5)
nRBC: 0.2 % (ref 0.0–0.2)

## 2023-09-21 LAB — CREATININE, SERUM
Creatinine, Ser: 0.71 mg/dL (ref 0.44–1.00)
GFR, Estimated: >60 mL/min (ref >60)

## 2023-09-21 MED ORDER — SCOPOLAMINE 1 MG/3DAYS TD PT72
MEDICATED_PATCH | TRANSDERMAL | Status: AC
  Filled 2023-09-21: qty 1

## 2023-09-21 MED ORDER — OXYTOCIN-SODIUM CHLORIDE 30-0.9 UT/500ML-% IV SOLN: 2.5000 [IU]/h | INTRAVENOUS | Status: AC

## 2023-09-21 MED ORDER — SODIUM CHLORIDE 0.9 % IV SOLN
INTRAVENOUS | Status: DC | PRN
  Administered 2023-09-20: 500 mg via INTRAVENOUS

## 2023-09-21 MED ORDER — NALOXONE HCL 4 MG/10ML IJ SOLN: 1.0000 ug/kg/h | INTRAVENOUS | Status: DC | PRN

## 2023-09-21 MED ORDER — OXYCODONE HCL 5 MG PO TABS: 5.0000 mg | ORAL_TABLET | Freq: Once | ORAL | Status: DC | PRN

## 2023-09-21 MED ORDER — DIPHENHYDRAMINE HCL 25 MG PO CAPS: 25.0000 mg | ORAL_CAPSULE | ORAL | Status: DC | PRN

## 2023-09-21 MED ORDER — NALOXONE HCL 0.4 MG/ML IJ SOLN: 0.4000 mg | INTRAMUSCULAR | Status: DC | PRN

## 2023-09-21 MED ORDER — WITCH HAZEL-GLYCERIN EX PADS: 1.0000 | MEDICATED_PAD | CUTANEOUS | Status: DC | PRN

## 2023-09-21 MED ORDER — CEFAZOLIN SODIUM-DEXTROSE 2-3 GM-%(50ML) IV SOLR
INTRAVENOUS | Status: DC | PRN
Start: 2023-09-20 — End: 2023-09-21
  Administered 2023-09-20: 2 g via INTRAVENOUS

## 2023-09-21 MED ORDER — FERROUS SULFATE 325 (65 FE) MG PO TABS
325.0000 mg | ORAL_TABLET | ORAL | Status: DC
  Administered 2023-09-21: 325 mg via ORAL
  Filled 2023-09-21: qty 1

## 2023-09-21 MED ORDER — OXYCODONE-ACETAMINOPHEN 5-325 MG PO TABS: 1.0000 | ORAL_TABLET | ORAL | Status: DC | PRN

## 2023-09-21 MED ORDER — LACTATED RINGERS IV SOLN: INTRAVENOUS | Status: DC | PRN

## 2023-09-21 MED ORDER — SCOPOLAMINE 1 MG/3DAYS TD PT72
1.0000 | MEDICATED_PATCH | Freq: Once | TRANSDERMAL | Status: DC
  Administered 2023-09-21: 1.5 mg via TRANSDERMAL

## 2023-09-21 MED ORDER — SENNOSIDES-DOCUSATE SODIUM 8.6-50 MG PO TABS
2.0000 | ORAL_TABLET | Freq: Every day | ORAL | Status: DC
  Administered 2023-09-22: 2 via ORAL
  Filled 2023-09-21: qty 2

## 2023-09-21 MED ORDER — COCONUT OIL OIL: 1.0000 | TOPICAL_OIL | Status: DC | PRN

## 2023-09-21 MED ORDER — OXYCODONE HCL 5 MG/5ML PO SOLN: 5.0000 mg | Freq: Once | ORAL | Status: DC | PRN

## 2023-09-21 MED ORDER — KETOROLAC TROMETHAMINE 30 MG/ML IJ SOLN
30.0000 mg | Freq: Once | INTRAMUSCULAR | Status: AC | PRN
  Administered 2023-09-21: 30 mg via INTRAVENOUS

## 2023-09-21 MED ORDER — SIMETHICONE 80 MG PO CHEW: 80.0000 mg | CHEWABLE_TABLET | ORAL | Status: DC | PRN

## 2023-09-21 MED ORDER — SIMETHICONE 80 MG PO CHEW
80.0000 mg | CHEWABLE_TABLET | Freq: Three times a day (TID) | ORAL | Status: DC
  Administered 2023-09-21 – 2023-09-22 (×5): 80 mg via ORAL
  Filled 2023-09-21 (×5): qty 1

## 2023-09-21 MED ORDER — MEPERIDINE HCL 25 MG/ML IJ SOLN: 6.2500 mg | INTRAMUSCULAR | Status: DC | PRN

## 2023-09-21 MED ORDER — DIPHENHYDRAMINE HCL 50 MG/ML IJ SOLN: 12.5000 mg | INTRAMUSCULAR | Status: DC | PRN

## 2023-09-21 MED ORDER — ONDANSETRON HCL 4 MG/2ML IJ SOLN: 4.0000 mg | Freq: Three times a day (TID) | INTRAMUSCULAR | Status: DC | PRN

## 2023-09-21 MED ORDER — DIBUCAINE (PERIANAL) 1 % EX OINT: 1.0000 | TOPICAL_OINTMENT | CUTANEOUS | Status: DC | PRN

## 2023-09-21 MED ORDER — HYDROMORPHONE HCL 1 MG/ML IJ SOLN: 0.2500 mg | INTRAMUSCULAR | Status: DC | PRN

## 2023-09-21 MED ORDER — MORPHINE SULFATE (PF) 0.5 MG/ML IJ SOLN
INTRAMUSCULAR | Status: DC | PRN
  Administered 2023-09-21: 3 mg via EPIDURAL

## 2023-09-21 MED ORDER — MISOPROSTOL 200 MCG PO TABS
ORAL_TABLET | ORAL | Status: AC
  Filled 2023-09-21: qty 5

## 2023-09-21 MED ORDER — PRENATAL MULTIVITAMIN CH
1.0000 | ORAL_TABLET | Freq: Every day | ORAL | Status: DC
  Administered 2023-09-21 – 2023-09-22 (×2): 1 via ORAL
  Filled 2023-09-21 (×2): qty 1

## 2023-09-21 MED ORDER — SODIUM CHLORIDE 0.9 % IV SOLN: INTRAVENOUS | Status: DC | PRN

## 2023-09-21 MED ORDER — KETOROLAC TROMETHAMINE 30 MG/ML IJ SOLN
INTRAMUSCULAR | Status: AC
  Filled 2023-09-21: qty 1

## 2023-09-21 MED ORDER — MENTHOL 3 MG MT LOZG: 1.0000 | LOZENGE | OROMUCOSAL | Status: DC | PRN

## 2023-09-21 MED ORDER — OXYTOCIN-SODIUM CHLORIDE 30-0.9 UT/500ML-% IV SOLN
INTRAVENOUS | Status: AC
  Filled 2023-09-21: qty 500

## 2023-09-21 MED ORDER — DIPHENHYDRAMINE HCL 25 MG PO CAPS: 25.0000 mg | ORAL_CAPSULE | Freq: Four times a day (QID) | ORAL | Status: DC | PRN

## 2023-09-21 MED ORDER — MISOPROSTOL 200 MCG PO TABS
1000.0000 ug | ORAL_TABLET | Freq: Once | ORAL | Status: AC
  Administered 2023-09-21: 1000 ug via RECTAL

## 2023-09-21 MED ORDER — SODIUM CHLORIDE 0.9% FLUSH: 3.0000 mL | INTRAVENOUS | Status: DC | PRN

## 2023-09-21 MED ORDER — ENOXAPARIN SODIUM 40 MG/0.4ML IJ SOSY
40.0000 mg | PREFILLED_SYRINGE | INTRAMUSCULAR | Status: DC
Start: 2023-09-21 — End: 2023-09-22
  Administered 2023-09-21: 40 mg via SUBCUTANEOUS
  Filled 2023-09-21: qty 0.4

## 2023-09-21 MED ORDER — ACETAMINOPHEN 10 MG/ML IV SOLN
INTRAVENOUS | Status: DC | PRN
  Administered 2023-09-21: 1000 mg via INTRAVENOUS

## 2023-09-21 MED ORDER — DEXAMETHASONE SODIUM PHOSPHATE 10 MG/ML IJ SOLN
INTRAMUSCULAR | Status: DC | PRN
  Administered 2023-09-20: 10 mg via INTRAVENOUS

## 2023-09-21 MED ORDER — IBUPROFEN 600 MG PO TABS
600.0000 mg | ORAL_TABLET | Freq: Four times a day (QID) | ORAL | Status: DC
  Administered 2023-09-21 – 2023-09-22 (×4): 600 mg via ORAL
  Filled 2023-09-21 (×7): qty 1

## 2023-09-21 MED ORDER — ONDANSETRON HCL 4 MG/2ML IJ SOLN: 4.0000 mg | Freq: Once | INTRAMUSCULAR | Status: DC | PRN

## 2023-09-21 NOTE — Lactation Note (Signed)
 This note was copied from a baby's chart. Lactation Consultation Note  Patient Name: Tara Reyes Unijb'd Date: 09/21/2023 Age:33 hours  Reason for consult: Mother's request;Follow-up assessment;Term;Primapara;Late-preterm 34-36.6wks;Difficult latch;Breastfeeding assistance  P1, [redacted]w[redacted]d, C/S  Mother requested LC assistance with breastfeeding. Baby has been sleepy and not latching. Mother has areola edema in both breast and nipple is retracting when the breast is compressed. Baby is unable to sustain her latch.   Basic breastfeeding education with baby placed in football hold. Baby was sleepy and would show eager feeding cues at times. Mother has rolling colostrum and with assistance baby would suckle in brief bursts.   Mother is using a manual pump prior to latch to evert nipples and collects colostrum. Mother taught reverse pressure softening. A 16 mm nipple shield was applied and baby latched and suckled with coaxing. Colostrum was seen in the NS when baby came off the breast.   Mother was instructed how to apply NS, observe for deep latch, listen for swallows and breast softening once milk is in and follow the feeding to observe for milk in the shield. Mother's nipple should appear rounded and not pinched.  Mother is frequently pumping with the manual pump.   Parents were taught how to feed baby with syringe and finger feeding and baby was fed 3 ml. She tolerated well.   Parents will continue with the same feeding plan: Pre-pump with manual pump Latch with or without the NS. Supplement with colostrum after breastfeeding. Consider using a DEBP if baby doesn't improve latching.  Call for assistance with breastfeeding.    Feeding Mother's Current Feeding Choice: Breast Milk  LATCH Score Latch: Repeated attempts needed to sustain latch, nipple held in mouth throughout feeding, stimulation needed to elicit sucking reflex.  Audible Swallowing: A few with stimulation  Type of  Nipple: Flat  Comfort (Breast/Nipple): Soft / non-tender (increased edema in the breast)  Hold (Positioning): Assistance needed to correctly position infant at breast and maintain latch.  LATCH Score: 6   Lactation Tools Discussed/Used Tools: Nipple Shields (using manual pump with comfort) Nipple shield size: 16 Flange Size: 18 Breast pump type: Manual Reason for Pumping: nipple eversion prior to latch and colostrum expression Pumping frequency: with every latch attempt Pumped volume: 3 mL  Interventions Interventions: Breast feeding basics reviewed;Assisted with latch;Breast massage;Breast compression;Support pillows;Adjust position  Discharge Pump: Personal;DEBP  Consult Status Consult Status: Follow-up Date: 09/22/23 Follow-up type: In-patient    Joshua Line M 09/21/2023, 1:48 PM

## 2023-09-21 NOTE — Op Note (Signed)
 Tara Reyes PROCEDURE DATE: 09/19/2023 - 09/20/2023  PREOPERATIVE DIAGNOSIS: Intrauterine pregnancy at  [redacted]w[redacted]d weeks gestation; failure to progress: arrest of descent  POSTOPERATIVE DIAGNOSIS: The same  PROCEDURE:     Cesarean Section  SURGEON:  Dr. Winton Felt  ASSISTANT: Dr. DELENA Sor  INDICATIONS: Tara Reyes is a 33 y.o. G1P1001 at [redacted]w[redacted]d scheduled for cesarean section secondary to failure to progress: arrest of descent.  The risks of cesarean section discussed with the patient included but were not limited to: bleeding which may require transfusion or reoperation; infection which may require antibiotics; injury to bowel, bladder, ureters or other surrounding organs; injury to the fetus; need for additional procedures including hysterectomy in the event of a life-threatening hemorrhage; placental abnormalities wth subsequent pregnancies, incisional problems, thromboembolic phenomenon and other postoperative/anesthesia complications. The patient concurred with the proposed plan, giving informed written consent for the procedure.    FINDINGS:  Viable female infant in cephalic presentation.  Apgars 9 and 9, weight, 9 pounds and 8 ounces.  Clear amniotic fluid.  Intact placenta, three vessel cord.  Normal uterus, fallopian tubes and ovaries bilaterally.  ANESTHESIA:    Epidural INTRAVENOUS FLUIDS:1000 ml ESTIMATED BLOOD LOSS: 362 ml URINE OUTPUT:  150 ml SPECIMENS: Placenta sent to L&D COMPLICATIONS: None immediate  PROCEDURE IN DETAIL:  The patient received intravenous antibiotics and had sequential compression devices applied to her lower extremities while in the preoperative area.  She was then taken to the operating room where anesthesia was induced and was found to be adequate. A foley catheter was placed into her bladder and attached to Tara Reyes gravity. She was then placed in a dorsal supine position with a leftward tilt, and prepped and draped in a sterile manner. After an  adequate timeout was performed, a Pfannenstiel skin incision was made with scalpel and carried through to the underlying layer of fascia. The fascia was incised in the midline and this incision was extended bilaterally bluntly. The rectus muscles were separated in the midline bluntly and the peritoneum was entered bluntly. The Alexis self-retaining retractor was introduced into the abdominal cavity. Attention was turned to the lower uterine segment where a bladder flap was created, and a transverse hysterotomy was made with a scalpel and extended bilaterally bluntly. The infant was successfully delivered. Delayed cord clamping was performed for 1 minute and the cord was clamped and cut and infant was handed over to awaiting neonatology team. Uterine massage was then administered and the placenta delivered intact with three-vessel cord. The uterus was cleared of clot and debris.  The hysterotomy was closed with 0 Vicryl in a running locked fashion. Overall, excellent hemostasis was noted. The pelvis was cleared of all clot and debris. Hemostasis was confirmed on all surfaces.  The peritoneum and the muscles were reapproximated using 0 vicryl interrupted stitches. The fascia was then closed using 0 Vicryl in a running fashion.  The skin was closed in a subcuticular fashion using 3.0 Vicryl. The patient tolerated the procedure well. Sponge, lap, instrument and needle counts were correct x 2. She was taken to the recovery room in stable condition.    Tara Reyes ConstantMD  09/21/2023 12:16 AM

## 2023-09-21 NOTE — Progress Notes (Signed)
 POSTPARTUM PROGRESS NOTE  POD #1  Subjective:  Tara Reyes is a 33 y.o. G1P1001 s/p primary LTCS at [redacted]w[redacted]d. No acute events overnight. She reports she is doing well. She denies any problems with ambulating, voiding or po intake. Denies nausea or vomiting. She has not passed flatus. Pain is well controlled.  Lochia is less than a period.  Objective: Blood pressure (!) 111/58, pulse 85, temperature 98.8 F (37.1 C), temperature source Oral, resp. rate 18, height 5' 2 (1.575 m), weight 77.7 kg, last menstrual period 12/16/2022, SpO2 95%, unknown if currently breastfeeding.  Physical Exam:  General: alert, cooperative and no distress Chest: no respiratory distress Heart: regular rate, distal pulses intact Uterine Fundus: firm, appropriately tender DVT Evaluation: No calf swelling or tenderness Extremities: No edema Skin: warm, dry; incision clean/dry/intact w/ honeycomb dressing in place  Recent Labs    09/19/23 2135 09/21/23 0429  HGB 12.6 8.5*  HCT 37.6 26.9*    Assessment/Plan: Tara Reyes is a 33 y.o. G1P1001 s/p pLTCS at [redacted]w[redacted]d for arrest of descent.  POD#1 - Doing welll; pain well controlled. H/H appropriate  Routine postpartum care  OOB, ambulated  Lovenox for VTE prophylaxis  Acute postoperative blood loss anemia - asymptomatic, HgB 12.6 > 8.5  Start po ferrous sulfate every other day  MDD - cont Prozac   early PPD screen  Hypothyroidism - TSH elevated on last labs  cont Synthroid   Contraception: OP IUD Feeding: Breast  Dispo: Plan for discharge POD#2 vs 3.   LOS: 2 days   Alain Sor, MD OB Fellow  09/21/2023, 7:11 AM

## 2023-09-21 NOTE — Transfer of Care (Signed)
 Immediate Anesthesia Transfer of Care Note  Patient: Tara Reyes  Procedure(s) Performed: CESAREAN DELIVERY (Abdomen)  Patient Location: PACU  Anesthesia Type:Epidural  Level of Consciousness: awake, alert , and oriented  Airway & Oxygen Therapy: Patient Spontanous Breathing  Post-op Assessment: Report given to RN and Post -op Vital signs reviewed and stable  Post vital signs: Reviewed and stable  Last Vitals:  Vitals Value Taken Time  BP 108/65 09/21/23 00:38  Temp    Pulse 94 09/21/23 00:43  Resp 20 09/21/23 00:43  SpO2 98 % 09/21/23 00:43  Vitals shown include unfiled device data.  Last Pain:  Vitals:   09/20/23 2131  TempSrc: Axillary  PainSc:       Patients Stated Pain Goal: 0 (09/19/23 2204)  Complications: No notable events documented.

## 2023-09-21 NOTE — Progress Notes (Signed)
 [name] was referred for history of depression.  * Referral screened out by Clinical Social Worker because none of the following criteria appear to apply:  ~ History of depression during this pregnancy, or of post-partum depression following prior delivery.  ~ Diagnosis of depression within last 3 years  OR  * [name]'s symptoms currently being treated with medication and/or therapy.  Per OB notes, [name] has an active prescription for Prozac  40mg  and participating in therapy for support.  Please contact the Clinical Social Worker if needs arise, by [facility] request, or if [name] scores greater than 9/yes to question 10 on Edinburgh Postpartum Depression Screen.  Tara Reyes, Tara Reyes Clinical Social Worker [phone]

## 2023-09-21 NOTE — Anesthesia Postprocedure Evaluation (Signed)
 Anesthesia Post Note  Patient: Tara Reyes  Procedure(s) Performed: CESAREAN DELIVERY (Abdomen)     Patient location during evaluation: Mother Baby Anesthesia Type: Epidural Level of consciousness: oriented and awake and alert Pain management: pain level controlled Vital Signs Assessment: post-procedure vital signs reviewed and stable Respiratory status: spontaneous breathing and respiratory function stable Cardiovascular status: blood pressure returned to baseline and stable Postop Assessment: no headache, no backache, no apparent nausea or vomiting and able to ambulate Anesthetic complications: no  No notable events documented.  Last Vitals:  Vitals:   09/21/23 0145 09/21/23 0205  BP: 109/84 (!) 122/58  Pulse: 85 90  Resp: 18 18  Temp: 36.8 C 37.6 C  SpO2: 97% 100%    Last Pain:  Vitals:   09/21/23 0205  TempSrc: Axillary  PainSc: 1    Pain Goal: Patients Stated Pain Goal: 0 (09/19/23 2204)  LLE Motor Response: Purposeful movement (09/21/23 0130) LLE Sensation: Numbness (09/21/23 0130) RLE Motor Response: Purposeful movement (09/21/23 0130) RLE Sensation: Numbness (09/21/23 0130)     Epidural/Spinal Function Cutaneous sensation: Able to Wiggle Toes (09/21/23 0205), Patient able to flex knees: Yes (09/21/23 0205), Patient able to lift hips off bed: Yes (09/21/23 0205), Back pain beyond tenderness at insertion site: No (09/21/23 0205), Progressively worsening motor and/or sensory loss: No (09/21/23 0205), Bowel and/or bladder incontinence post epidural: No (09/21/23 0205)  Garnette DELENA Gab

## 2023-09-21 NOTE — Patient Instructions (Signed)
 Your appointment with Outpatient Lactation is: Date: 10/05/2023  Time: 11:30 AM [facility] (First Floor) [address]  [place]  Check in under baby's name.  Please bring your baby hungry along with your pump and a bottle of either formula or expressed breast milk. Please also bring your pump flanges and we welcome support people! If you need lactation assistance before your appointment, please call [phone] and press 4 for lactation.

## 2023-09-21 NOTE — Discharge Summary (Signed)
 Postpartum Discharge Summary  Date of Service updated***     Patient Name: Tara Reyes DOB: November 14, 1990 MRN: [id number]  Date of admission: 09/19/2023 Delivery date:09/20/2023 Delivering provider: CONSTANT, PEGGY Date of discharge: 09/21/2023  Admitting diagnosis: Encounter for induction of labor [Z34.90] Intrauterine pregnancy: [redacted]w[redacted]d     Secondary diagnosis:  Principal Problem:   Cesarean delivery delivered Active Problems:   Depression, recurrent (HCC)   Hypothyroid   Encounter for induction of labor  Additional problems: ***    Discharge diagnosis: Term Pregnancy Delivered                                              Post partum procedures:{Postpartum procedures:23558} Augmentation: Pitocin  Complications: {OB Labor/Delivery Complications:20784}  Hospital course: Onset of Labor With Unplanned C/S   33 y.o. yo G1P1001 at [redacted]w[redacted]d was admitted in Latent Labor on 09/19/2023. Patient had a labor course significant for failure to descend despite 4.5 hrs pushing total. The patient went for cesarean section due to Arrest of Descent. Delivery details as follows: Membrane Rupture Time/Date: 2:18 AM,09/20/2023  Delivery Method:C-Section, Low Transverse Operative Delivery:N/A Details of operation can be found in separate operative note. Patient had a postpartum course complicated by***.  She is ambulating,tolerating a regular diet, passing flatus, and urinating well.  Patient is discharged home in stable condition 09/21/23.  Newborn Data: Birth date:09/20/2023 Birth time:11:45 PM Gender:Female Living status:Living Apgars:9 ,9  Weight:4310 g  Magnesium Sulfate received: {Mag received:30440022} BMZ received: No Rhophylac:N/A MMR:N/A T-DaP:Given prenatally Flu: Yes RSV Vaccine received: No Transfusion:{Transfusion received:30440034}  Immunizations received: Immunization History  Administered Date(s) Administered   DTaP 12/31/1990, 02/02/1991, 05/02/1991, 03/02/1992,  07/28/1995   HIB (PRP-OMP) 11/05/1990, 12/31/1990, 04/29/1991, 01/09/1992   HIB, Unspecified 11/05/1990, 12/31/1990, 04/29/1991, 01/09/1992   HPV Quadrivalent 03/22/2006, 06/21/2006, 09/21/2006   Hepatitis A 06/06/2005, 03/22/2006   Hepatitis B 10/07/1990, 11/05/1990, 06/24/1991   IPV 11/05/1990, 12/31/1990, 05/02/1991, 08/17/1995   Influenza Split 02/03/2022   Influenza,inj,Quad PF,6+ Mos 12/22/2014, 12/23/2015, 12/22/2016, 12/22/2019   Influenza,trivalent, recombinat, inj, PF 01/18/1998, 03/16/1998, 01/19/2011   Influenza-Unspecified 12/22/2014, 12/20/2017, 12/28/2020   MMR 01/09/1992, 08/17/1995   Meningococcal Acwy, Unspecified 06/06/2005, 06/17/2010   Meningococcal Conjugate 06/17/2010   Meningococcal polysaccharide vaccine (MPSV4) 06/06/2005   PFIZER(Purple Top)SARS-COV-2 Vaccination 03/17/2019, 04/05/2019   PPD Test 06/05/2011, 05/17/2016   Td (Adult), 2 Lf Tetanus Toxid, Preservative Free 07/08/2001   Tdap 06/21/2006, 05/17/2016, 07/04/2023   Varicella 09/16/1993, 06/06/2005    Physical exam  Vitals:   09/20/23 1931 09/20/23 2001 09/20/23 2131 09/20/23 2231  BP: 104/64 (!) 99/56 126/81 133/79  Pulse: 79 84 (!) 113 (!) 105  Resp: 17 18 17 18   Temp:   99.9 F (37.7 C)   TempSrc:   Axillary   SpO2:      Weight:      Height:       General: {Exam; general:21111117} Lochia: {Desc; appropriate/inappropriate:30686::appropriate} Uterine Fundus: {Desc; firm/soft:30687} Incision: {Exam; incision:21111123} DVT Evaluation: {Exam; dvt:2111122} Labs: Lab Results  Component Value Date   WBC 15.8 (H) 09/19/2023   HGB 12.6 09/19/2023   HCT 37.6 09/19/2023   MCV 82.3 09/19/2023   PLT 260 09/19/2023      Latest Ref Rng & Units 02/13/2022    8:09 AM  CMP  Glucose 70 - 99 mg/dL 94   BUN 6 - 23 mg/dL 15   Creatinine 9.59 -  1.20 mg/dL 9.34   Sodium 864 - 854 mEq/L 138   Potassium 3.5 - 5.1 mEq/L 3.5   Chloride 96 - 112 mEq/L 103   CO2 19 - 32 mEq/L 28   Calcium 8.4 -  10.5 mg/dL 9.1   Total Protein 6.0 - 8.3 g/dL 7.4   Total Bilirubin 0.2 - 1.2 mg/dL 0.4   Alkaline Phos 39 - 117 U/L 55   AST 0 - 37 U/L 22   ALT 0 - 35 U/L 13    Edinburgh Score:     No data to display         No data recorded  After visit meds:  Allergies as of 09/21/2023       Reactions   Other    Vicryl Suture   Penicillins Hives     Med Rec must be completed prior to using this [facility]***        Discharge home in stable condition Infant Feeding: Breast Infant Disposition:home with mother Discharge instruction: per After Visit Summary and Postpartum booklet. Activity: Advance as tolerated. Pelvic rest for 6 weeks.  Diet: routine diet Future Appointments: Future Appointments  Date Time Provider Department Center  09/27/2023  1:30 PM Erik Kieth BROCKS, MD [facility]   Follow up Visit: Message sent to [facility] 6/27  Please schedule this patient for a In person postpartum visit in 6 weeks with the following provider: Any provider. Additional Postpartum F/U:Incision check 1 week  Low risk pregnancy complicated by: None Delivery mode:  C-Section, Low Transverse Anticipated Birth Control:  outpatient IUD   09/21/2023 Alain Sor, MD

## 2023-09-21 NOTE — Lactation Note (Signed)
 This note was copied from a baby's chart. Lactation Consultation Note  Patient Name: Tara Reyes Unijb'd Date: 09/21/2023 Age:33 hours Reason for consult: Initial assessment;1st time breastfeeding;Term ([name] C/S delivery and LGA infant greater than 9 lbs at birth.)  [name] did reverse pressure soften prior to latching infant at the breast to help evert nipple shaft out more. Infant briefly latched for 5 minutes on [name] left breast using the football hold position. Infant stopped breastfeeding due to mucus coming out of her nose and mouth. LC suction and burped infant. [name] attempted re-latch infant but infant was not longer interested in breastfeeding. LC used breast model and [name] expressed 4 mls of colostrum that was spoon fed to infant. [name] was given hand pump with 18 mm breast flange to pre-pump breast prior to latching infant to help with nipple eversion, when demonstrating how to use [name] easily expressed 3 mls of colostrum that she will offer to infant at the next feeding after infant latches at the breast. LC discussed [name] ask Pediatrician, MD to assess infant's mouth, LC observe lingual frenulum skin attached to tongue and floor of infant's mouth. LC discussed the importance of maternal rest, meals and hydration. [name] was  made aware of O/P services, breastfeeding support groups, community resources, and our phone # for post-discharge questions.    1- [name] will pre-pump with hand pump to help evert nipple shaft out more and then latch infant to breast. 2- [name] will continue to breastfeed infant by cues, on demand, 8+ times within 24 hours, skin to skin. 3- [name] will ask for further latch assistance if needed.   Maternal Data Has patient been taught Hand Expression?: Yes Does the patient have breastfeeding experience prior to this delivery?: No  Feeding Mother's Current Feeding Choice: Breast Milk  LATCH Score Latch: Grasps breast easily, tongue down, lips flanged, rhythmical  sucking.  Audible Swallowing: A few with stimulation  Type of Nipple: Flat  Comfort (Breast/Nipple): Soft / non-tender  Hold (Positioning): Assistance needed to correctly position infant at breast and maintain latch.  LATCH Score: 7   Lactation Tools Discussed/Used Tools: Pump;Flanges Flange Size: 18 Breast pump type: Manual Pump Education: Setup, frequency, and cleaning;Milk Storage Reason for Pumping: to help evert nipple shaft out more. Pumped volume: 3 mL  Interventions Interventions: Breast feeding basics reviewed;Assisted with latch;Skin to skin;Breast compression;Adjust position;Support pillows;Position options;Hand express;Pre-pump if needed;Expressed milk;Hand pump;CDC milk storage guidelines;CDC Guidelines for Breast Pump Cleaning;Education;LC Services brochure  Discharge Pump: Personal;DEBP ([name] has Spectra  DEBP at home.)  Consult Status Consult Status: Follow-up Date: 09/21/23 Follow-up type: In-patient    Grayce LULLA Batter 09/21/2023, 3:22 AM

## 2023-09-22 ENCOUNTER — Other Ambulatory Visit (HOSPITAL_COMMUNITY): Payer: Self-pay

## 2023-09-22 ENCOUNTER — Encounter (HOSPITAL_COMMUNITY): Payer: Self-pay | Admitting: Obstetrics and Gynecology

## 2023-09-22 LAB — BIRTH TISSUE RECOVERY COLLECTION (PLACENTA DONATION)

## 2023-09-22 MED ORDER — FERROUS SULFATE 325 (65 FE) MG PO TABS
325.0000 mg | ORAL_TABLET | ORAL | 3 refills | Status: AC
  Filled 2023-09-22: qty 90, 180d supply, fill #0

## 2023-09-22 MED ORDER — IBUPROFEN 600 MG PO TABS
600.0000 mg | ORAL_TABLET | Freq: Four times a day (QID) | ORAL | 0 refills | Status: DC
  Filled 2023-09-22: qty 30, 8d supply, fill #0

## 2023-09-22 MED ORDER — SENNOSIDES-DOCUSATE SODIUM 8.6-50 MG PO TABS
2.0000 | ORAL_TABLET | Freq: Every day | ORAL | 0 refills | Status: DC
  Filled 2023-09-22: qty 60, 30d supply, fill #0

## 2023-09-22 MED ORDER — OXYCODONE HCL 5 MG PO TABS
5.0000 mg | ORAL_TABLET | Freq: Four times a day (QID) | ORAL | 0 refills | Status: DC | PRN
  Filled 2023-09-22: qty 10, 3d supply, fill #0

## 2023-09-22 NOTE — Lactation Note (Signed)
 This note was copied from a baby's chart. Lactation Consultation Note  Patient Name: Tara Reyes Unijb'd Date: 09/22/2023 Age:33 hours Reason for consult: 1st time breastfeeding;Term  P1, 39 wks, @ 37 hrs of life. Discharge anticipated today. Mom putting infant to bare breast with LC arrival. Discussed steps of latching. Mom does well, starts with hand expression. Discussed a few tips, getting baby deeper onto breast. Hand expression and breast compression keep baby working well @ breast. Football positioning seen on both breasts.  Mom has hand pump from previous shift with multiple flange sizes- discussed sizing dynamic with breast changes,  Encouraged mom to keep working on big mouth latch with baby and use EBM or coconut oil- coconut oil provided- after each feed. Discussed cluster feeding overnight/ early morning brings in our milk supply, shared expectations of milk coming in. Highlighted risk of engorgement. Discussed hand pump/express to soften breasts, motrin  as anti-inflammatory, and ice packs for 10-20 minutes post feed/pumping if still over-full is the best treatments for inflamed/engorged breasts. Highlighted hand pump best tool for engorgement/ softening of breast. Mom continues to work on breast after LC exits.  Maternal Data Has patient been taught Hand Expression?: Yes Does the patient have breastfeeding experience prior to this delivery?: No  Feeding Mother's Current Feeding Choice: Breast Milk  LATCH Score Latch: Grasps breast easily, tongue down, lips flanged, rhythmical sucking.  Audible Swallowing: Spontaneous and intermittent  Type of Nipple: Everted at rest and after stimulation  Comfort (Breast/Nipple): Soft / non-tender  Hold (Positioning): Assistance needed to correctly position infant at breast and maintain latch.  LATCH Score: 9   Lactation Tools Discussed/Used Nipple shield size: 16;20 (Mom has both sizes going home) Breast pump type: Manual Pump  Education: Milk Storage  Interventions Interventions: Breast feeding basics reviewed;Assisted with latch;Hand express;Breast compression;Adjust position;Expressed milk;Coconut oil;Hand pump;Education;LC Services brochure;CDC milk storage guidelines (Flange sizing handout)  Discharge Discharge Education: Engorgement and breast care Pump: DEBP;Manual;Personal  Consult Status Consult Status: Complete Date: 09/22/23 Follow-up type: In-patient    [facility] 09/22/2023, 1:49 PM

## 2023-09-24 ENCOUNTER — Other Ambulatory Visit: Payer: Self-pay | Admitting: Family Medicine

## 2023-09-25 NOTE — Telephone Encounter (Signed)
 Patient is due for physical and is not currently scheduled, please have her schedule that and we will get refills sent in for her

## 2023-09-25 NOTE — Telephone Encounter (Signed)
 Called and LM for her to call back and make an appt for physical

## 2023-09-26 NOTE — Telephone Encounter (Signed)
 Called, LM to return call. Patient just had baby on 6/25

## 2023-09-27 ENCOUNTER — Encounter: Payer: Self-pay | Admitting: Family Medicine

## 2023-09-27 ENCOUNTER — Other Ambulatory Visit (HOSPITAL_COMMUNITY): Payer: Self-pay

## 2023-09-27 ENCOUNTER — Ambulatory Visit: Admitting: Obstetrics and Gynecology

## 2023-09-27 ENCOUNTER — Other Ambulatory Visit: Payer: Self-pay

## 2023-09-27 DIAGNOSIS — Z4889 Encounter for other specified surgical aftercare: Secondary | ICD-10-CM

## 2023-09-27 DIAGNOSIS — F339 Major depressive disorder, recurrent, unspecified: Secondary | ICD-10-CM

## 2023-09-27 MED ORDER — FLUOXETINE HCL 40 MG PO CAPS
40.0000 mg | ORAL_CAPSULE | Freq: Every day | ORAL | 0 refills | Status: DC
  Filled 2023-09-27: qty 30, 30d supply, fill #0
  Filled 2023-11-11: qty 30, 30d supply, fill #1
  Filled 2023-12-27 – 2024-01-17 (×2): qty 30, 30d supply, fill #2

## 2023-09-27 NOTE — Progress Notes (Signed)
 Subjective:     Tara Reyes is a 33 y.o. female who presents to the clinic 1 weeks status post low uterine, transverse cesarean section. Pt reports incision is healing well. Pt reported removed honeycomb dressing this morning. 2 steri strips in place, removed by RN at visit.     Objective:    LMP 12/16/2022  General:  alert, well appearing, in no apparent distress  Incision:   healing well, no drainage, no erythema, no hernia, no seroma, no swelling, no dehiscence, incision well approximated     Assessment:    Doing well postoperatively.   Plan:   1.Continue any current medications. 2. Wound care discussed and when to return to office or notify provider if has a concern. 3. Follow up: at postpartum appointment.   Silvano LELON Piano, RN

## 2023-09-27 NOTE — Telephone Encounter (Signed)
 Attempted a call but had to LM

## 2023-09-27 NOTE — Telephone Encounter (Signed)
 Called, LM Letter Out

## 2023-10-01 ENCOUNTER — Inpatient Hospital Stay (HOSPITAL_COMMUNITY)

## 2023-10-01 ENCOUNTER — Other Ambulatory Visit (HOSPITAL_COMMUNITY): Payer: Self-pay

## 2023-10-01 ENCOUNTER — Inpatient Hospital Stay (HOSPITAL_COMMUNITY): Admission: RE | Admit: 2023-10-01 | Payer: BC Managed Care – PPO | Source: Home / Self Care | Admitting: Family Medicine

## 2023-10-01 NOTE — Telephone Encounter (Signed)
 Attempted call, unable to reach, LM to call back

## 2023-10-05 ENCOUNTER — Encounter: Payer: Self-pay | Admitting: Obstetrics and Gynecology

## 2023-10-09 ENCOUNTER — Telehealth: Payer: Self-pay

## 2023-10-09 ENCOUNTER — Other Ambulatory Visit: Payer: Self-pay

## 2023-10-09 NOTE — Telephone Encounter (Signed)
 Spoke with Brooke/pharmacist at [facility] and called in All Purpose Nipple Ointment with verbal orders for twice daily for 5 days.  Silvano LELON Piano, RN

## 2023-10-16 ENCOUNTER — Other Ambulatory Visit (HOSPITAL_COMMUNITY): Payer: Self-pay

## 2023-11-01 ENCOUNTER — Ambulatory Visit: Admitting: Obstetrics and Gynecology

## 2023-11-01 ENCOUNTER — Encounter: Payer: Self-pay | Admitting: Obstetrics and Gynecology

## 2023-11-01 DIAGNOSIS — M6208 Separation of muscle (nontraumatic), other site: Secondary | ICD-10-CM

## 2023-11-01 DIAGNOSIS — E039 Hypothyroidism, unspecified: Secondary | ICD-10-CM

## 2023-11-01 DIAGNOSIS — Z8659 Personal history of other mental and behavioral disorders: Secondary | ICD-10-CM

## 2023-11-01 NOTE — Progress Notes (Signed)
 Post Partum Visit Note  Tara Reyes is a 33 y.o. G62P1001 female who presents for a postpartum visit. She is 6 weeks postpartum following a primary cesarean section.  I have fully reviewed the prenatal and intrapartum course. The delivery was at 39  and 5 days gestational weeks.  Anesthesia: epidural. Postpartum course has been good. Baby is doing well. Baby is feeding by both breast and bottle - Similac Advance. Bleeding no bleeding. Bowel function is normal. Bladder function is normal. Patient is not sexually active. Contraception method is none. Postpartum depression screening: negative.  Breastfeeding, struggling with low supply. Working with lactation.  The pregnancy intention screening data noted above was reviewed. Potential methods of contraception were discussed. The patient elected to proceed with No data recorded.    Health Maintenance Due  Topic Date Due   INFLUENZA VACCINE  10/26/2023    The following portions of the patient's history were reviewed and updated as appropriate: allergies, current medications, past family history, past medical history, past social history, past surgical history, and problem list.  Review of Systems Pertinent items are noted in HPI.  Objective:  BP 116/80   Pulse 81   Ht 5' 2 (1.575 m)   Wt 148 lb 1.9 oz (67.2 kg)   LMP 12/16/2022   Breastfeeding Yes   BMI 27.09 kg/m    General:  alert, cooperative, and appears stated age   Breasts:  deferred  Lungs: Nonlabored breathing  Heart:  Regular rate  Abdomen: Soft, nontender, nondistended   Wound Pfannenstiel incision clean/dry/intact  GU exam:  deferred       Assessment:  1. Postpartum exam (Primary)  2. Hypothyroidism, unspecified type - TSH Rfx on Abnormal to Free T4  3. Diastasis recti PT referral per pt request  4. History of depression Counseled on postpartum depression. Patient without symptoms today.    Plan:   Essential components of care per ACOG  recommendations:  1.  Mood and well being: Patient with negative depression screening today. Reviewed local resources for support.  - Patient tobacco use? No.   - hx of drug use? No.    2. Infant care and feeding:  -Patient currently breastmilk feeding? Yes. Discussed returning to work and pumping. Reviewed importance of draining breast regularly to support lactation.  -Social determinants of health (SDOH) reviewed in EPIC. No concerns  - reviewed resources for breastfeeding and low supply options  3. Sexuality, contraception and birth spacing - Patient does want a pregnancy in the next year.    - Reviewed reproductive life planning. Reviewed contraceptive methods based on pt preferences and effectiveness.  Patient declines contraception today but plans to return for IUD - Discussed birth spacing of 18 months  4. Sleep and fatigue -Encouraged family/partner/community support of 4 hrs of uninterrupted sleep to help with mood and fatigue  5. Physical Recovery  - Discussed patients delivery and complications. She describes her labor as good. - Patient had a C-section.   - Patient has urinary incontinence? No. - Patient is safe to resume physical and sexual activity  6.  Health Maintenance - HM due items addressed No -   - Last pap smear  Diagnosis  Date Value Ref Range Status  11/08/2022   Final   - Negative for intraepithelial lesion or malignancy (NILM)   Pap smear not done at today's visit.  -Breast Cancer screening indicated? No.   7. Chronic Disease/Pregnancy Condition follow up: None  - PCP follow up  Rollo  ONEIDA Bring, MD [facility] [employer], [facility]

## 2023-11-02 ENCOUNTER — Ambulatory Visit: Payer: Self-pay | Admitting: Obstetrics and Gynecology

## 2023-11-02 LAB — TSH RFX ON ABNORMAL TO FREE T4: TSH: 0.931 u[IU]/mL (ref 0.450–4.500)

## 2023-11-29 DIAGNOSIS — Z713 Dietary counseling and surveillance: Secondary | ICD-10-CM | POA: Diagnosis not present

## 2023-12-10 ENCOUNTER — Encounter: Payer: Self-pay | Admitting: Obstetrics and Gynecology

## 2023-12-14 DIAGNOSIS — Z713 Dietary counseling and surveillance: Secondary | ICD-10-CM | POA: Diagnosis not present

## 2023-12-24 DIAGNOSIS — O924 Hypogalactia: Secondary | ICD-10-CM | POA: Diagnosis not present

## 2023-12-24 DIAGNOSIS — Z713 Dietary counseling and surveillance: Secondary | ICD-10-CM | POA: Diagnosis not present

## 2023-12-28 ENCOUNTER — Other Ambulatory Visit: Payer: Self-pay

## 2023-12-28 ENCOUNTER — Encounter: Payer: Self-pay | Admitting: Pharmacist

## 2023-12-31 DIAGNOSIS — O924 Hypogalactia: Secondary | ICD-10-CM | POA: Diagnosis not present

## 2023-12-31 DIAGNOSIS — Z713 Dietary counseling and surveillance: Secondary | ICD-10-CM | POA: Diagnosis not present

## 2024-01-02 ENCOUNTER — Other Ambulatory Visit: Payer: Self-pay

## 2024-01-17 ENCOUNTER — Other Ambulatory Visit (HOSPITAL_COMMUNITY): Payer: Self-pay

## 2024-01-30 DIAGNOSIS — L918 Other hypertrophic disorders of the skin: Secondary | ICD-10-CM | POA: Diagnosis not present

## 2024-02-18 ENCOUNTER — Other Ambulatory Visit (HOSPITAL_COMMUNITY): Payer: Self-pay

## 2024-02-18 ENCOUNTER — Other Ambulatory Visit: Payer: Self-pay | Admitting: Obstetrics and Gynecology

## 2024-02-18 DIAGNOSIS — E039 Hypothyroidism, unspecified: Secondary | ICD-10-CM

## 2024-02-18 DIAGNOSIS — F339 Major depressive disorder, recurrent, unspecified: Secondary | ICD-10-CM

## 2024-02-18 MED ORDER — FLUOXETINE HCL 40 MG PO CAPS
40.0000 mg | ORAL_CAPSULE | Freq: Every day | ORAL | 0 refills | Status: DC
  Filled 2024-02-18: qty 30, 30d supply, fill #0

## 2024-02-18 NOTE — Telephone Encounter (Signed)
 Further refills need to be through PCP

## 2024-02-19 ENCOUNTER — Other Ambulatory Visit: Payer: Self-pay

## 2024-02-25 ENCOUNTER — Encounter: Payer: Self-pay | Admitting: Family Medicine

## 2024-02-25 ENCOUNTER — Ambulatory Visit: Admitting: Family Medicine

## 2024-02-25 VITALS — BP 110/64 | HR 76 | Temp 98.5°F | Ht 62.0 in | Wt 148.6 lb

## 2024-02-25 DIAGNOSIS — Z0000 Encounter for general adult medical examination without abnormal findings: Secondary | ICD-10-CM | POA: Diagnosis not present

## 2024-02-25 DIAGNOSIS — E039 Hypothyroidism, unspecified: Secondary | ICD-10-CM

## 2024-02-25 LAB — HEPATIC FUNCTION PANEL
ALT: 31 U/L (ref 0–35)
AST: 30 U/L (ref 0–37)
Albumin: 4.5 g/dL (ref 3.5–5.2)
Alkaline Phosphatase: 102 U/L (ref 39–117)
Bilirubin, Direct: 0.1 mg/dL (ref 0.0–0.3)
Total Bilirubin: 0.4 mg/dL (ref 0.2–1.2)
Total Protein: 7.5 g/dL (ref 6.0–8.3)

## 2024-02-25 LAB — LIPID PANEL
Cholesterol: 185 mg/dL (ref 0–200)
HDL: 73.3 mg/dL (ref >39.00)
LDL Cholesterol: 103 mg/dL — ABNORMAL HIGH (ref 0–99)
NonHDL: 111.45
Total CHOL/HDL Ratio: 3
Triglycerides: 43 mg/dL (ref 0.0–149.0)
VLDL: 8.6 mg/dL (ref 0.0–40.0)

## 2024-02-25 LAB — BASIC METABOLIC PANEL WITH GFR
BUN: 15 mg/dL (ref 6–23)
CO2: 28 meq/L (ref 19–32)
Calcium: 9.6 mg/dL (ref 8.4–10.5)
Chloride: 102 meq/L (ref 96–112)
Creatinine, Ser: 0.66 mg/dL (ref 0.40–1.20)
GFR: 115.25 mL/min (ref >60.00)
Glucose, Bld: 86 mg/dL (ref 70–99)
Potassium: 4 meq/L (ref 3.5–5.1)
Sodium: 139 meq/L (ref 135–145)

## 2024-02-25 LAB — TSH: TSH: 1.15 u[IU]/mL (ref 0.35–5.50)

## 2024-02-25 LAB — CBC WITH DIFFERENTIAL/PLATELET
Basophils Absolute: 0 K/uL (ref 0.0–0.1)
Basophils Relative: 0.4 % (ref 0.0–3.0)
Eosinophils Absolute: 0.2 K/uL (ref 0.0–0.7)
Eosinophils Relative: 1.9 % (ref 0.0–5.0)
HCT: 42.2 % (ref 36.0–46.0)
Hemoglobin: 14 g/dL (ref 12.0–15.0)
Lymphocytes Relative: 16.8 % (ref 12.0–46.0)
Lymphs Abs: 1.4 K/uL (ref 0.7–4.0)
MCHC: 33.1 g/dL (ref 30.0–36.0)
MCV: 88.2 fl (ref 78.0–100.0)
Monocytes Absolute: 0.6 K/uL (ref 0.1–1.0)
Monocytes Relative: 7.9 % (ref 3.0–12.0)
Neutro Abs: 5.9 K/uL (ref 1.4–7.7)
Neutrophils Relative %: 73 % (ref 43.0–77.0)
Platelets: 322 K/uL (ref 150.0–400.0)
RBC: 4.79 Mil/uL (ref 3.87–5.11)
RDW: 14.5 % (ref 11.5–15.5)
WBC: 8.1 K/uL (ref 4.0–10.5)

## 2024-02-25 NOTE — Assessment & Plan Note (Signed)
Pt's PE WNL.  UTD on pap, Tdap, flu.  Check labs.  Anticipatory guidance provided.  

## 2024-02-25 NOTE — Patient Instructions (Signed)
 Follow up in 1 year or as needed We'll notify you of your lab results and make any changes if needed Keep up the good work on healthy diet and regular exercise- you look great!!! Call with any questions or concerns Stay Safe!  Stay Healthy! CONGRATS!!  She's GORGEOUS!!!

## 2024-02-25 NOTE — Progress Notes (Signed)
   Subjective:    Patient ID: Tara Reyes, female    DOB: 1990/08/11, 33 y.o.   MRN: [id number]  HPI CPE- UTD on pap, Tdap, flu  Patient Care Team    Relationship Specialty Notifications Start End  Mahlon Comer BRAVO, MD PCP - General Family Medicine  08/30/15    Comment: Lavell Mahlon Comer BRAVO, MD  Family Medicine  08/30/15    Comment: Lavell Solo, Ted Morrison, DO Consulting Physician Obstetrics and Gynecology  02/13/22     Health Maintenance  Topic Date Due   Cervical Cancer Screening (HPV/Pap Cotest)  11/08/2027   DTaP/Tdap/Td (9 - Td or Tdap) 07/03/2033   Influenza Vaccine  Completed   Hepatitis B Vaccines 19-59 Average Risk  Completed   HPV VACCINES  Completed   Pneumococcal Vaccine  Aged Out   Meningococcal B Vaccine  Aged Out   COVID-19 Vaccine  Discontinued   Hepatitis C Screening  Discontinued   HIV Screening  Discontinued      Review of Systems Patient reports no vision/hearing changes, adenopathy, fever, weight change,  persistant/recurrent hoarseness , swallowing issues, chest pain, palpitations, edema, persistant/recurrent cough, hemoptysis, dyspnea (rest/exertional/paroxysmal nocturnal), gastrointestinal bleeding (melena, rectal bleeding), abdominal pain, significant heartburn, bowel changes, GU symptoms (dysuria, hematuria, incontinence), Gyn symptoms (abnormal  bleeding, pain),  syncope, focal weakness, memory loss, numbness & tingling, skin/hair/nail changes, abnormal bruising or bleeding, anxiety, or depression.     Objective:   Physical Exam General Appearance:    Alert, cooperative, no distress, appears stated age  Head:    Normocephalic, without obvious abnormality, atraumatic  Eyes:    PERRL, conjunctiva/corneas clear, EOM's intact both eyes  Ears:    Normal TM's and external ear canals, both ears  Nose:   Nares normal, septum midline, mucosa normal, no drainage    or sinus tenderness  Throat:   Lips, mucosa, and tongue normal; teeth and gums  normal  Neck:   Supple, symmetrical, trachea midline, no adenopathy;    Thyroid : no enlargement/tenderness/nodules  Back:     Symmetric, no curvature, ROM normal, no CVA tenderness  Lungs:     Clear to auscultation bilaterally, respirations unlabored  Chest Wall:    No tenderness or deformity   Heart:    Regular rate and rhythm, S1 and S2 normal, no murmur, rub   or gallop  Breast Exam:    Deferred to GYN  Abdomen:     Soft, non-tender, bowel sounds active all four quadrants,    no masses, no organomegaly  Genitalia:    Deferred to GYN  Rectal:    Extremities:   Extremities normal, atraumatic, no cyanosis or edema  Pulses:   2+ and symmetric all extremities  Skin:   Skin color, texture, turgor normal, no rashes or lesions  Lymph nodes:   Cervical, supraclavicular, and axillary nodes normal  Neurologic:   CNII-XII intact, normal strength, sensation and reflexes    throughout          Assessment & Plan:

## 2024-02-26 ENCOUNTER — Ambulatory Visit: Payer: Self-pay | Admitting: Family Medicine

## 2024-03-06 ENCOUNTER — Encounter: Payer: Self-pay | Admitting: Family Medicine

## 2024-03-06 DIAGNOSIS — E039 Hypothyroidism, unspecified: Secondary | ICD-10-CM

## 2024-03-06 DIAGNOSIS — F339 Major depressive disorder, recurrent, unspecified: Secondary | ICD-10-CM

## 2024-03-07 MED ORDER — FLUOXETINE HCL 40 MG PO CAPS: 40.0000 mg | ORAL_CAPSULE | Freq: Every day | ORAL | 3 refills | Status: AC

## 2024-03-07 NOTE — Addendum Note (Signed)
 Addended by: Brynnley Dayrit E on: 03/07/2024 01:30 PM   Modules accepted: Orders

## 2024-04-01 ENCOUNTER — Other Ambulatory Visit (HOSPITAL_COMMUNITY): Payer: Self-pay

## 2025-02-25 ENCOUNTER — Encounter: Admitting: Family Medicine
